# Patient Record
Sex: Female | Born: 1975 | Race: Black or African American | Hispanic: No | Marital: Married | Smoking: Former smoker
Health system: Southern US, Community
[De-identification: ages and names within clinical notes are randomized; demographics above are authoritative.]

## PROBLEM LIST (undated history)

## (undated) ENCOUNTER — Inpatient Hospital Stay (HOSPITAL_COMMUNITY): Payer: Self-pay

## (undated) DIAGNOSIS — J302 Other seasonal allergic rhinitis: Secondary | ICD-10-CM

## (undated) DIAGNOSIS — I10 Essential (primary) hypertension: Secondary | ICD-10-CM

## (undated) DIAGNOSIS — F32A Depression, unspecified: Secondary | ICD-10-CM

## (undated) DIAGNOSIS — K219 Gastro-esophageal reflux disease without esophagitis: Secondary | ICD-10-CM

## (undated) DIAGNOSIS — F419 Anxiety disorder, unspecified: Secondary | ICD-10-CM

## (undated) DIAGNOSIS — A609 Anogenital herpesviral infection, unspecified: Secondary | ICD-10-CM

## (undated) DIAGNOSIS — D219 Benign neoplasm of connective and other soft tissue, unspecified: Secondary | ICD-10-CM

## (undated) DIAGNOSIS — G43909 Migraine, unspecified, not intractable, without status migrainosus: Secondary | ICD-10-CM

## (undated) DIAGNOSIS — O09513 Supervision of elderly primigravida, third trimester: Secondary | ICD-10-CM

## (undated) HISTORY — DX: Essential (primary) hypertension: I10

## (undated) HISTORY — DX: Gastro-esophageal reflux disease without esophagitis: K21.9

## (undated) HISTORY — DX: Supervision of elderly primigravida, third trimester: O09.513

## (undated) HISTORY — DX: Depression, unspecified: F32.A

## (undated) HISTORY — PX: WISDOM TOOTH EXTRACTION: SHX21

## (undated) HISTORY — DX: Anxiety disorder, unspecified: F41.9

## (undated) HISTORY — DX: Other seasonal allergic rhinitis: J30.2

## (undated) HISTORY — DX: Anogenital herpesviral infection, unspecified: A60.9

---

## 2003-04-19 ENCOUNTER — Emergency Department (HOSPITAL_COMMUNITY): Admission: AD | Admit: 2003-04-19 | Discharge: 2003-04-19 | Payer: Self-pay | Admitting: Family Medicine

## 2004-03-19 ENCOUNTER — Emergency Department (HOSPITAL_COMMUNITY): Admission: EM | Admit: 2004-03-19 | Discharge: 2004-03-19 | Payer: Self-pay | Admitting: Family Medicine

## 2004-09-03 ENCOUNTER — Emergency Department (HOSPITAL_COMMUNITY): Admission: EM | Admit: 2004-09-03 | Discharge: 2004-09-03 | Payer: Self-pay | Admitting: Emergency Medicine

## 2007-11-07 ENCOUNTER — Emergency Department (HOSPITAL_COMMUNITY): Admission: EM | Admit: 2007-11-07 | Discharge: 2007-11-07 | Payer: Self-pay | Admitting: Emergency Medicine

## 2007-11-09 ENCOUNTER — Emergency Department (HOSPITAL_COMMUNITY): Admission: EM | Admit: 2007-11-09 | Discharge: 2007-11-09 | Payer: Self-pay | Admitting: Emergency Medicine

## 2008-01-08 ENCOUNTER — Encounter: Admission: RE | Admit: 2008-01-08 | Discharge: 2008-01-08 | Payer: Self-pay | Admitting: Neurological Surgery

## 2008-02-05 ENCOUNTER — Encounter: Admission: RE | Admit: 2008-02-05 | Discharge: 2008-02-05 | Payer: Self-pay | Admitting: Neurological Surgery

## 2008-03-04 ENCOUNTER — Encounter: Admission: RE | Admit: 2008-03-04 | Discharge: 2008-03-04 | Payer: Self-pay | Admitting: Dermatology

## 2009-10-02 ENCOUNTER — Emergency Department (HOSPITAL_COMMUNITY): Admission: EM | Admit: 2009-10-02 | Discharge: 2009-10-02 | Payer: Self-pay | Admitting: Emergency Medicine
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
## 2011-01-01 LAB — CBC
HCT: 43.8
Hemoglobin: 14.6
MCHC: 33.4
WBC: 7

## 2011-01-01 LAB — DIFFERENTIAL
Basophils Absolute: 0
Basophils Relative: 0
Eosinophils Absolute: 0
Eosinophils Relative: 0
Lymphs Abs: 1

## 2011-02-26 ENCOUNTER — Other Ambulatory Visit: Payer: Self-pay

## 2011-02-26 ENCOUNTER — Encounter: Payer: Self-pay | Admitting: *Deleted

## 2011-02-26 ENCOUNTER — Emergency Department (HOSPITAL_COMMUNITY)
Admission: EM | Admit: 2011-02-26 | Discharge: 2011-02-26 | Disposition: A | Discharge status: Home / Self Care | Payer: 59 | Attending: Emergency Medicine | Admitting: Emergency Medicine

## 2011-02-26 DIAGNOSIS — R0789 Other chest pain: Secondary | ICD-10-CM

## 2011-02-26 DIAGNOSIS — R079 Chest pain, unspecified: Secondary | ICD-10-CM | POA: Insufficient documentation

## 2011-02-26 DIAGNOSIS — F17200 Nicotine dependence, unspecified, uncomplicated: Secondary | ICD-10-CM | POA: Insufficient documentation

## 2011-02-26 DIAGNOSIS — Z79899 Other long term (current) drug therapy: Secondary | ICD-10-CM | POA: Insufficient documentation

## 2011-02-26 LAB — URINALYSIS, ROUTINE W REFLEX MICROSCOPIC
Bilirubin Urine: NEGATIVE
Ketones, ur: 15 mg/dL — AB
Nitrite: NEGATIVE
Protein, ur: NEGATIVE mg/dL
Specific Gravity, Urine: 1.019 (ref 1.005–1.030)

## 2011-02-26 LAB — CBC
MCH: 29.2 pg (ref 26.0–34.0)
MCHC: 33 g/dL (ref 30.0–36.0)
RBC: 4.69 MIL/uL (ref 3.87–5.11)
RDW: 13.8 % (ref 11.5–15.5)
WBC: 4.8 10*3/uL (ref 4.0–10.5)

## 2011-02-26 LAB — URINE MICROSCOPIC-ADD ON

## 2011-02-26 LAB — TROPONIN I: Troponin I: <0.3 ng/mL (ref <0.30)

## 2011-02-26 MED ORDER — FAMOTIDINE 20 MG PO TABS: 20.0000 mg | ORAL_TABLET | Freq: Two times a day (BID) | ORAL | Status: DC

## 2011-02-26 NOTE — ED Notes (Signed)
The pt woke up from a nap approx 1330 today with generalized chest pain with sl nausea initially none now. No previous history

## 2011-02-26 NOTE — ED Notes (Signed)
Pt waiting on lab results

## 2011-02-26 NOTE — ED Notes (Signed)
The pt is c/o of the same chest pain no distress.  Some better.  [place]

## 2011-02-26 NOTE — ED Provider Notes (Signed)
History     CSN: [id number] Arrival date & time: 02/26/2011  6:38 PM   First MD Initiated Contact with Patient 02/26/11 2146      Chief Complaint  Patient presents with  . Chest Pain    (Consider location/radiation/quality/duration/timing/severity/associated sxs/prior treatment) Patient is a 35 y.o. female presenting with chest pain. The history is provided by the patient.  Chest Pain The chest pain began 6 - 12 hours ago. Duration of episode(s) is 5 minutes. Chest pain occurs intermittently. The chest pain is improving. The pain is currently at 0/10. The quality of the pain is described as burning. The pain does not radiate. Chest pain is worsened by eating. She tried nothing for the symptoms. Risk factors include no known risk factors.     Past Medical History  Diagnosis Date  . Head ache     History reviewed. No pertinent past surgical history.  History reviewed. No pertinent family history.  History  Substance Use Topics  . Smoking status: Current Everyday Smoker  . Smokeless tobacco: Not on file  . Alcohol Use: Yes    OB History    Grav Para Term Preterm Abortions TAB SAB Ect Mult Living                  Review of Systems  Cardiovascular: Positive for chest pain.  All other systems reviewed and are negative.    Allergies  Review of patient's allergies indicates no known allergies.  Home Medications   Current Outpatient Rx  Name Route Sig Dispense Refill  . CETIRIZINE HCL 10 MG PO TABS Oral Take 10 mg by mouth daily.      . CYCLOBENZAPRINE HCL 10 MG PO TABS Oral Take 10 mg by mouth 3 (three) times daily as needed. For spasms     . LEVONORGESTREL-ETHINYL ESTRAD 90-20 MCG PO TABS Oral Take 1 tablet by mouth daily.      Carma Leaven M PLUS PO TABS Oral Take 1 tablet by mouth daily.      Marland Kitchen PROMETHAZINE HCL 25 MG PO TABS Oral Take 25 mg by mouth every 6 (six) hours as needed. For nausea from migraines     . SERTRALINE HCL 50 MG PO TABS Oral Take 50 mg by mouth  daily.      . SUMATRIPTAN SUCCINATE 100 MG PO TABS Oral Take 100 mg by mouth every 2 (two) hours as needed. For migraines       BP 138/90  Pulse 78  Temp(Src) 98.2 F (36.8 C) (Oral)  Resp 18  SpO2 100%  LMP 01/26/2011  Physical Exam  Constitutional: She is oriented to person, place, and time. She appears well-developed and well-nourished.  HENT:  Head: Normocephalic.  Eyes: Pupils are equal, round, and reactive to light.  Neck: Normal range of motion. Neck supple.  Cardiovascular: Normal rate, regular rhythm and normal heart sounds.   Pulmonary/Chest: Effort normal and breath sounds normal.  Abdominal: Soft. Bowel sounds are normal.  Musculoskeletal: Normal range of motion.  Neurological: She is alert and oriented to person, place, and time.  Skin: Skin is warm and dry.  Psychiatric: She has a normal mood and affect.    ED Course  Procedures (including critical care time)  Date: 02/26/2011  Rate:70  Rhythm: normal sinus rhythm  QRS Axis: normal  Intervals: normal  ST/T Wave abnormalities: normal  Conduction Disutrbances:none  Narrative Interpretation:   Old EKG Reviewed: none available  Labs Reviewed  URINALYSIS, ROUTINE W REFLEX MICROSCOPIC -  Abnormal; Notable for the following:    Appearance CLOUDY (*)    Ketones, ur 15 (*)    Leukocytes, UA SMALL (*)    All other components within normal limits  URINE MICROSCOPIC-ADD ON - Abnormal; Notable for the following:    Squamous Epithelial / LPF MANY (*)    Bacteria, UA MANY (*)    All other components within normal limits  CBC  PREGNANCY, URINE  TROPONIN I   No results found.   No diagnosis found.  Urine will be cultured--patient will be contacted if results indicate treatment needed.  Labs reviewed and discussed with patient.  MDM  Chest pain--suspect GI component        Jimmye Norman, NP 02/26/11 2256  Jimmye Norman, NP 02/26/11 [phone]

## 2011-02-27 NOTE — ED Provider Notes (Signed)
Medical screening examination/treatment/procedure(s) were performed by non-physician practitioner and as supervising physician I was immediately available for consultation/collaboration.  Nicholes Stairs, MD 02/27/11 [phone]

## 2013-11-20 DIAGNOSIS — D259 Leiomyoma of uterus, unspecified: Secondary | ICD-10-CM | POA: Insufficient documentation

## 2014-03-08 DIAGNOSIS — F419 Anxiety disorder, unspecified: Secondary | ICD-10-CM | POA: Insufficient documentation

## 2014-09-23 ENCOUNTER — Encounter (HOSPITAL_COMMUNITY): Payer: Self-pay

## 2014-09-23 ENCOUNTER — Emergency Department (HOSPITAL_COMMUNITY): Payer: Self-pay

## 2014-09-23 ENCOUNTER — Emergency Department (HOSPITAL_COMMUNITY)
Admission: EM | Admit: 2014-09-23 | Discharge: 2014-09-23 | Disposition: A | Discharge status: Home / Self Care | Payer: Self-pay | Attending: Emergency Medicine | Admitting: Emergency Medicine

## 2014-09-23 DIAGNOSIS — R1032 Left lower quadrant pain: Secondary | ICD-10-CM

## 2014-09-23 DIAGNOSIS — R110 Nausea: Secondary | ICD-10-CM

## 2014-09-23 DIAGNOSIS — R42 Dizziness and giddiness: Secondary | ICD-10-CM

## 2014-09-23 DIAGNOSIS — R000 Tachycardia, unspecified: Secondary | ICD-10-CM | POA: Insufficient documentation

## 2014-09-23 DIAGNOSIS — Z8679 Personal history of other diseases of the circulatory system: Secondary | ICD-10-CM | POA: Insufficient documentation

## 2014-09-23 DIAGNOSIS — I951 Orthostatic hypotension: Secondary | ICD-10-CM | POA: Insufficient documentation

## 2014-09-23 DIAGNOSIS — R102 Pelvic and perineal pain unspecified side: Secondary | ICD-10-CM

## 2014-09-23 DIAGNOSIS — D259 Leiomyoma of uterus, unspecified: Secondary | ICD-10-CM

## 2014-09-23 DIAGNOSIS — Z86018 Personal history of other benign neoplasm: Secondary | ICD-10-CM | POA: Insufficient documentation

## 2014-09-23 DIAGNOSIS — N922 Excessive menstruation at puberty: Secondary | ICD-10-CM

## 2014-09-23 DIAGNOSIS — Z79899 Other long term (current) drug therapy: Secondary | ICD-10-CM | POA: Insufficient documentation

## 2014-09-23 DIAGNOSIS — Z793 Long term (current) use of hormonal contraceptives: Secondary | ICD-10-CM | POA: Insufficient documentation

## 2014-09-23 DIAGNOSIS — N939 Abnormal uterine and vaginal bleeding, unspecified: Secondary | ICD-10-CM

## 2014-09-23 DIAGNOSIS — R1031 Right lower quadrant pain: Secondary | ICD-10-CM

## 2014-09-23 DIAGNOSIS — Z87891 Personal history of nicotine dependence: Secondary | ICD-10-CM | POA: Insufficient documentation

## 2014-09-23 DIAGNOSIS — Z3202 Encounter for pregnancy test, result negative: Secondary | ICD-10-CM | POA: Insufficient documentation

## 2014-09-23 HISTORY — DX: Benign neoplasm of connective and other soft tissue, unspecified: D21.9

## 2014-09-23 HISTORY — DX: Migraine, unspecified, not intractable, without status migrainosus: G43.909

## 2014-09-23 LAB — COMPREHENSIVE METABOLIC PANEL
ALBUMIN: 3.6 g/dL (ref 3.5–5.0)
ALT: 18 U/L (ref 14–54)
ANION GAP: 6 (ref 5–15)
AST: 24 U/L (ref 15–41)
Alkaline Phosphatase: 53 U/L (ref 38–126)
BUN: 9 mg/dL (ref 6–20)
CALCIUM: 8.6 mg/dL — AB (ref 8.9–10.3)
CO2: 26 mmol/L (ref 22–32)
CREATININE: 0.96 mg/dL (ref 0.44–1.00)
Chloride: 104 mmol/L (ref 101–111)
GFR calc Af Amer: >60 mL/min (ref >60)
GFR calc non Af Amer: >60 mL/min (ref >60)
GLUCOSE: 97 mg/dL (ref 65–99)
Potassium: 3.8 mmol/L (ref 3.5–5.1)
Sodium: 136 mmol/L (ref 135–145)
TOTAL PROTEIN: 6.9 g/dL (ref 6.5–8.1)
Total Bilirubin: 0.4 mg/dL (ref 0.3–1.2)

## 2014-09-23 LAB — URINE MICROSCOPIC-ADD ON

## 2014-09-23 LAB — URINALYSIS, ROUTINE W REFLEX MICROSCOPIC
Bilirubin Urine: NEGATIVE
Glucose, UA: NEGATIVE mg/dL
KETONES UR: NEGATIVE mg/dL
Leukocytes, UA: NEGATIVE
Nitrite: NEGATIVE
Protein, ur: NEGATIVE mg/dL
Specific Gravity, Urine: 1.018 (ref 1.005–1.030)
UROBILINOGEN UA: 0.2 mg/dL (ref 0.0–1.0)
pH: 6 (ref 5.0–8.0)

## 2014-09-23 LAB — CBC WITH DIFFERENTIAL/PLATELET
BASOS PCT: 0 % (ref 0–1)
Basophils Absolute: 0 10*3/uL (ref 0.0–0.1)
EOS ABS: 0 10*3/uL (ref 0.0–0.7)
EOS PCT: 0 % (ref 0–5)
HEMATOCRIT: 34.5 % — AB (ref 36.0–46.0)
Hemoglobin: 11.1 g/dL — ABNORMAL LOW (ref 12.0–15.0)
Lymphocytes Relative: 21 % (ref 12–46)
Lymphs Abs: 1.4 10*3/uL (ref 0.7–4.0)
MCH: 27.9 pg (ref 26.0–34.0)
MCHC: 32.2 g/dL (ref 30.0–36.0)
MCV: 86.7 fL (ref 78.0–100.0)
MONOS PCT: 6 % (ref 3–12)
Monocytes Absolute: 0.4 10*3/uL (ref 0.1–1.0)
NEUTROS ABS: 5.1 10*3/uL (ref 1.7–7.7)
Neutrophils Relative %: 73 % (ref 43–77)
PLATELETS: 262 10*3/uL (ref 150–400)
RBC: 3.98 MIL/uL (ref 3.87–5.11)
RDW: 14.5 % (ref 11.5–15.5)
WBC: 6.9 10*3/uL (ref 4.0–10.5)

## 2014-09-23 LAB — WET PREP, GENITAL
CLUE CELLS WET PREP: NONE SEEN
TRICH WET PREP: NONE SEEN
Yeast Wet Prep HPF POC: NONE SEEN

## 2014-09-23 LAB — LIPASE, BLOOD: LIPASE: 11 U/L — AB (ref 22–51)

## 2014-09-23 LAB — POC URINE PREG, ED: Preg Test, Ur: NEGATIVE

## 2014-09-23 MED ORDER — SODIUM CHLORIDE 0.9 % IV BOLUS (SEPSIS)
1000.0000 mL | Freq: Once | INTRAVENOUS | Status: AC
  Administered 2014-09-23: 1000 mL via INTRAVENOUS

## 2014-09-23 MED ORDER — MORPHINE SULFATE 4 MG/ML IJ SOLN
4.0000 mg | Freq: Once | INTRAMUSCULAR | Status: AC
  Administered 2014-09-23: 4 mg via INTRAVENOUS
  Filled 2014-09-23: qty 1

## 2014-09-23 MED ORDER — ONDANSETRON HCL 8 MG PO TABS: 8.0000 mg | ORAL_TABLET | Freq: Three times a day (TID) | ORAL | Status: DC | PRN

## 2014-09-23 MED ORDER — ONDANSETRON HCL 8 MG PO TABS
8.0000 mg | ORAL_TABLET | Freq: Three times a day (TID) | ORAL | Status: DC | PRN
Start: 2014-09-23 — End: 2014-09-23

## 2014-09-23 MED ORDER — ONDANSETRON HCL 4 MG/2ML IJ SOLN
4.0000 mg | Freq: Once | INTRAMUSCULAR | Status: AC
  Administered 2014-09-23: 4 mg via INTRAVENOUS
  Filled 2014-09-23: qty 2

## 2014-09-23 MED ORDER — MEFENAMIC ACID 250 MG PO CAPS: 500.0000 mg | ORAL_CAPSULE | Freq: Three times a day (TID) | ORAL | Status: DC

## 2014-09-23 MED ORDER — HYDROCODONE-ACETAMINOPHEN 5-325 MG PO TABS: 1.0000 | ORAL_TABLET | Freq: Four times a day (QID) | ORAL | Status: DC | PRN

## 2014-09-23 NOTE — ED Notes (Signed)
Pt c/o lower abdominal pain and heavy vaginal bleeding x 3 days.  Pain score 7/10.  Pt reports that she is on her menstrual period, but it has never been this heavy.  Pt reports "gushes of blood that saturate extra large pads" x 3 times per day.  Hx of fibroids.

## 2014-09-23 NOTE — Discharge Instructions (Signed)
Your vaginal bleeding and lower abdomen pain is likely from uterine fibroids. Your lightheadedness was from mild dehydration and from the heavy menstrual cycle you're experiencing, but your blood counts are not dangerously low. You will need to use mefenamic acid as directed to help with your pain and bleeding, and follow up with your OBGYN in 3-5 days for recheck. Use tylenol or ibuprofen as needed for pain, or use norco as directed as needed for severe pain but don't drive while taking norco. Stay very well hydrated. Use zofran as directed as needed for nausea. Return to the ER for changes or worsening symptoms.  Abdominal (belly) pain can be caused by many things. Your caregiver performed an examination and possibly ordered blood/urine tests and imaging (CT scan, x-rays, ultrasound). Many cases can be observed and treated at home after initial evaluation in the emergency department. Even though you are being discharged home, abdominal pain can be unpredictable. Therefore, you need a repeated exam if your pain does not resolve, returns, or worsens. Most patients with abdominal pain don't have to be admitted to the hospital or have surgery, but serious problems like appendicitis and gallbladder attacks can start out as nonspecific pain. Many abdominal conditions cannot be diagnosed in one visit, so follow-up evaluations are very important. SEEK IMMEDIATE MEDICAL ATTENTION IF YOU DEVELOP ANY OF THE FOLLOWING SYMPTOMS:  The pain does not go away or becomes severe.   A temperature above 101 develops.   Repeated vomiting occurs (multiple episodes).   The pain becomes localized to portions of the abdomen. The right side could possibly be appendicitis. In an adult, the left lower portion of the abdomen could be colitis or diverticulitis.   Blood is being passed in stools or vomit (bright red or black tarry stools).   Return also if you develop chest pain, difficulty breathing, dizziness or fainting, or  become confused, poorly responsive, or inconsolable (young children).  The constipation stays for more than 4 days.   There is belly (abdominal) or rectal pain.   You do not seem to be getting better.     Abnormal Uterine Bleeding Abnormal uterine bleeding means bleeding from the vagina that is not your normal menstrual period. This can be:  Bleeding or spotting between periods.  Bleeding after sex (sexual intercourse).  Bleeding that is heavier or more than normal.  Periods that last longer than usual.  Bleeding after menopause. There are many problems that may cause this. Treatment will depend on the cause of the bleeding. Any kind of bleeding that is not normal should be reviewed by your doctor.  HOME CARE Watch your condition for any changes. These actions may lessen any discomfort you are having:  Do not use tampons or douches as told by your doctor.  Change your pads often. You should get regular pelvic exams and Pap tests. Keep all appointments for tests as told by your doctor. GET HELP IF:  You are bleeding for more than 1 week.  You feel dizzy at times. GET HELP RIGHT AWAY IF:   You pass out.  You have to change pads every 15 to 30 minutes.  You have belly pain.  You have a fever.  You become sweaty or weak.  You are passing large blood clots from the vagina.  You feel sick to your stomach (nauseous) and throw up (vomit). MAKE SURE YOU:  Understand these instructions.  Will watch your condition.  Will get help right away if you are not doing well  or get worse. Document Released: 01/17/2009 Document Revised: 03/27/2013 Document Reviewed: 10/19/2012 [facility] Patient Information 2015 [place], [place]. This information is not intended to replace advice given to you by your health care provider. Make sure you discuss any questions you have with your health care provider.  Uterine Fibroid A uterine fibroid is a growth (tumor) in your uterus. It is not  cancerous. Some fibroids cause pain or heavy bleeding during and in between periods. [place] care depends on how you were treated. In general:  Keep all doctor visits.  Only take medicine as told by your doctor. Do not take aspirin.  Talk to your doctor about taking iron pills.  If you have painful periods that are not heavy, lie down with your feet up. Put cold packs on your belly (abdomen).  If you have heavy periods, tell your doctor how many pads or tampons you use in a month.  Eat green vegetables. GET HELP RIGHT AWAY IF:   You have lower belly (pelvic) pain or cramps not helped by medicine.  You have sudden lower belly pain that gets worse.  You have more bleeding between and during your periods.  If you soak your tampon or pad in 30 minutes or less.  You feel lightheaded or faint. MAKE SURE YOU:   Understand these instructions.  Will watch your condition.  Will get help right away if you are not doing well or get worse. Document Released: 06/18/2008 Document Revised: 01/10/2013 Document Reviewed: 10/19/2012 [facility] Patient Information 2015 [place], [place]. This information is not intended to replace advice given to you by your health care provider. Make sure you discuss any questions you have with your health care provider.  Menorrhagia Menorrhagia is the medical term for when your menstrual periods are heavy or last longer than usual. With menorrhagia, every period you have may cause enough blood loss and cramping that you are unable to maintain your usual activities. CAUSES  In some cases, the cause of heavy periods is unknown, but a number of conditions may cause menorrhagia. Common causes include:  A problem with the hormone-producing thyroid gland (hypothyroid).  Noncancerous growths in the uterus (polyps or fibroids).  An imbalance of the estrogen and progesterone hormones.  One of your ovaries not releasing an egg during one or more months.  Side  effects of having an intrauterine device (IUD).  Side effects of some medicines, such as anti-inflammatory medicines or blood thinners.  A bleeding disorder that stops your blood from clotting normally. SIGNS AND SYMPTOMS  During a normal period, bleeding lasts between 4 and 8 days. Signs that your periods are too heavy include:  You routinely have to change your pad or tampon every 1 or 2 hours because it is completely soaked.  You pass blood clots larger than 1 inch (2.5 cm) in size.  You have bleeding for more than 7 days.  You need to use pads and tampons at the same time because of heavy bleeding.  You need to wake up to change your pads or tampons during the night.  You have symptoms of anemia, such as tiredness, fatigue, or shortness of breath. DIAGNOSIS  Your health care provider will perform a physical exam and ask you questions about your symptoms and menstrual history. Other tests may be ordered based on what the health care provider finds during the exam. These tests can include:  Blood tests. Blood tests are used to check if you are pregnant or have hormonal changes, a  bleeding or thyroid disorder, low iron levels (anemia), or other problems.  Endometrial biopsy. Your health care provider takes a sample of tissue from the inside of your uterus to be examined under a microscope.  Pelvic ultrasound. This test uses sound waves to make a picture of your uterus, ovaries, and vagina. The pictures can show if you have fibroids or other growths.  Hysteroscopy. For this test, your health care provider will use a small telescope to look inside your uterus. Based on the results of your initial tests, your health care provider may recommend further testing. TREATMENT  Treatment may not be needed. If it is needed, your health care provider may recommend treatment with one or more medicines first. If these do not reduce bleeding enough, a surgical treatment might be an option. The best  treatment for you will depend on:   Whether you need to prevent pregnancy.  Your desire to have children in the future.  The cause and severity of your bleeding.  Your opinion and personal preference.  Medicines for menorrhagia may include:  Birth control methods that use hormones. These include the pill, skin patch, vaginal ring, shots that you get every 3 months, hormonal IUD, and implant. These treatments reduce bleeding during your menstrual period.  Medicines that thicken blood and slow bleeding.  Medicines that reduce swelling, such as ibuprofen.  Medicines that contain a synthetic hormone called progestin.   Medicines that make the ovaries stop working for a short time.  You may need surgical treatment for menorrhagia if the medicines are unsuccessful. Treatment options include:  Dilation and curettage (D&C). In this procedure, your health care provider opens (dilates) your cervix and then scrapes or suctions tissue from the lining of your uterus to reduce menstrual bleeding.  Operative hysteroscopy. This procedure uses a tiny tube with a light (hysteroscope) to view your uterine cavity and can help in the surgical removal of a polyp that may be causing heavy periods.  Endometrial ablation. Through various techniques, your health care provider permanently destroys the entire lining of your uterus (endometrium). After endometrial ablation, most women have little or no menstrual flow. Endometrial ablation reduces your ability to become pregnant.  Endometrial resection. This surgical procedure uses an electrosurgical wire loop to remove the lining of the uterus. This procedure also reduces your ability to become pregnant.  Hysterectomy. Surgical removal of the uterus and cervix is a permanent procedure that stops menstrual periods. Pregnancy is not possible after a hysterectomy. This procedure requires anesthesia and hospitalization. HOME CARE INSTRUCTIONS   Only take  over-the-counter or prescription medicines as directed by your health care provider. Take prescribed medicines exactly as directed. Do not change or switch medicines without consulting your health care provider.  Take any prescribed iron pills exactly as directed by your health care provider. Long-term heavy bleeding may result in low iron levels. Iron pills help replace the iron your body lost from heavy bleeding. Iron may cause constipation. If this becomes a problem, increase the bran, fruits, and roughage in your diet.  Do not take aspirin or medicines that contain aspirin 1 week before or during your menstrual period. Aspirin may make the bleeding worse.  If you need to change your sanitary pad or tampon more than once every 2 hours, stay in bed and rest as much as possible until the bleeding stops.  Eat well-balanced meals. Eat foods high in iron. Examples are leafy green vegetables, meat, liver, eggs, and whole grain breads and cereals. Do  not try to lose weight until the abnormal bleeding has stopped and your blood iron level is back to normal. SEEK MEDICAL CARE IF:   You soak through a pad or tampon every 1 or 2 hours, and this happens every time you have a period.  You need to use pads and tampons at the same time because you are bleeding so much.  You need to change your pad or tampon during the night.  You have a period that lasts for more than 8 days.  You pass clots bigger than 1 inch wide.  You have irregular periods that happen more or less often than once a month.  You feel dizzy or faint.  You feel very weak or tired.  You feel short of breath or feel your heart is beating too fast when you exercise.  You have nausea and vomiting or diarrhea while you are taking your medicine.  You have any problems that may be related to the medicine you are taking. SEEK IMMEDIATE MEDICAL CARE IF:   You soak through 4 or more pads or tampons in 2 hours.  You have any bleeding  while you are pregnant. MAKE SURE YOU:   Understand these instructions.  Will watch your condition.  Will get help right away if you are not doing well or get worse. Document Released: 03/22/2005 Document Revised: 03/27/2013 Document Reviewed: 09/10/2012 [facility] Patient Information 2015 [place], [place]. This information is not intended to replace advice given to you by your health care provider. Make sure you discuss any questions you have with your health care provider.  Nausea, Adult Nausea is the feeling that you have an upset stomach or have to vomit. Nausea by itself is not likely a serious concern, but it may be an early sign of more serious medical problems. As nausea gets worse, it can lead to vomiting. If vomiting develops, there is the risk of dehydration.  CAUSES   Viral infections.  Food poisoning.  Medicines.  Pregnancy.  Motion sickness.  Migraine headaches.  Emotional distress.  Severe pain from any source.  Alcohol intoxication. HOME CARE INSTRUCTIONS  Get plenty of rest.  Ask your caregiver about specific rehydration instructions.  Eat small amounts of food and sip liquids more often.  Take all medicines as told by your caregiver. SEEK MEDICAL CARE IF:  You have not improved after 2 days, or you get worse.  You have a headache. SEEK IMMEDIATE MEDICAL CARE IF:   You have a fever.  You faint.  You keep vomiting or have blood in your vomit.  You are extremely weak or dehydrated.  You have dark or bloody stools.  You have severe chest or abdominal pain. MAKE SURE YOU:  Understand these instructions.  Will watch your condition.  Will get help right away if you are not doing well or get worse. Document Released: 04/29/2004 Document Revised: 12/15/2011 Document Reviewed: 12/02/2010 [facility] Patient Information 2015 [place], [place]. This information is not intended to replace advice given to you by your health care provider. Make sure you  discuss any questions you have with your health care provider.

## 2014-09-23 NOTE — Progress Notes (Signed)
Pt confirms Aimee Kerr is  pcp EPIC updated

## 2014-09-23 NOTE — ED Notes (Signed)
PA in room

## 2014-09-23 NOTE — ED Provider Notes (Signed)
CSN: [id number]     Arrival date & time 09/23/14  1030 History   First MD Initiated Contact with Patient 09/23/14 1209     Chief Complaint  Patient presents with  . Abdominal Pain  . Vaginal Bleeding     (Consider location/radiation/quality/duration/timing/severity/associated sxs/prior Treatment) HPI Comments: Aimee Kerr is a 39 y.o. female with a PMHx of uterine fibroids and chronic headaches/migraines, who presents to the ED with complaints of heavy vaginal bleeding and lower abdominal pain 3 days. She reports her vaginal bleeding is bright red with passage of clots, consuming 34 extra-large pads per day, heavier than her normal menses. She is currently on her menstrual cycle, stating that she takes [facility] and therefore she only has 4 menses yearly, starting this menses on Friday. She endorses associated 8/10 lower abdominal cramping which is intermittent, nonradiating, worse with episodes of vaginal bleeding, and improved with tramadol. Additional associated symptoms include nausea and lightheadedness. Her OB/GYN is Dr. Benjaman Kindler. She denies any fevers, chills, chest pain, shortness of breath, dizziness, syncope, headache, vision changes, vomiting, diarrhea, constipation, dysuria, hematuria, vaginal discharge, vaginal itching or lesions, numbness, tingling, or focal weakness. She is sexually active with 1 partner, unprotected.  Patient is a 39 y.o. female presenting with vaginal bleeding. The history is provided by the patient. No language interpreter was used.  Vaginal Bleeding Quality:  Bright red, clots and heavier than menses Severity:  Moderate Onset quality:  Gradual Duration:  3 days Timing:  Constant Progression:  Worsening Chronicity:  New Menstrual history:  Regular (on seasonique, only gets 4 menses yearly) Number of pads used:  3-4 extra large pads/day Possible pregnancy: no   Context: spontaneously   Relieved by: tramadol. Worsened by:  Nothing  tried Ineffective treatments:  None tried Associated symptoms: abdominal pain and nausea   Associated symptoms: no dizziness, no dysuria, no fever and no vaginal discharge   Risk factors: unprotected sex   Risk factors comment:  Hx of fibroids   Past Medical History  Diagnosis Date  . Head ache   . Fibroids   . Migraines    Past Surgical History  Procedure Laterality Date  . Wisdom tooth extraction     History reviewed. No pertinent family history. History  Substance Use Topics  . Smoking status: Former Research scientist (life sciences)  . Smokeless tobacco: Not on file  . Alcohol Use: Yes     Comment: occ   OB History    No data available     Review of Systems  Constitutional: Negative for fever and chills.  Eyes: Negative for visual disturbance.  Respiratory: Negative for shortness of breath.   Cardiovascular: Negative for chest pain.  Gastrointestinal: Positive for nausea and abdominal pain. Negative for vomiting, diarrhea, constipation and blood in stool.  Genitourinary: Positive for vaginal bleeding. Negative for dysuria, hematuria, flank pain, vaginal discharge, genital sores, vaginal pain and menstrual problem.  Musculoskeletal: Negative for myalgias and arthralgias.  Skin: Negative for color change.  Allergic/Immunologic: Negative for immunocompromised state.  Neurological: Positive for light-headedness. Negative for dizziness, syncope, weakness, numbness and headaches.  Hematological: Does not bruise/bleed easily.  Psychiatric/Behavioral: Negative for confusion.   10 Systems reviewed and are negative for acute change except as noted in the HPI.    Allergies  Other; Formaldehyde; and Sulfa antibiotics  Home Medications   Prior to Admission medications   Medication Sig Start Date End Date Taking? Authorizing Provider  levonorgestrel-ethinyl estradiol (SEASONALE,INTROVALE,JOLESSA) 0.15-0.03 MG tablet Take 1 tablet by mouth daily.  Yes Historical Provider, MD  loratadine (CLARITIN)  10 MG tablet Take 10 mg by mouth daily.   Yes Historical Provider, MD  Multiple Vitamins-Minerals (MULTIVITAMINS THER. W/MINERALS) TABS Take 1 tablet by mouth daily.     Yes Historical Provider, MD   BP 140/86 mmHg  Pulse 117  Temp(Src) 98.3 F (36.8 C) (Oral)  Resp 18  SpO2 100%  LMP 09/19/2014 Physical Exam  Constitutional: She is oriented to person, place, and time. Vital signs are normal. She appears well-developed and well-nourished.  Non-toxic appearance. No distress.  Afebrile, nontoxic, NAD  HENT:  Head: Normocephalic and atraumatic.  Mouth/Throat: Oropharynx is clear and moist and mucous membranes are normal.  Eyes: Conjunctivae and EOM are normal. Right eye exhibits no discharge. Left eye exhibits no discharge.  Neck: Normal range of motion. Neck supple.  Cardiovascular: Regular rhythm, normal heart sounds and intact distal pulses.  Tachycardia present.  Exam reveals no gallop and no friction rub.   No murmur heard. Mildly tachycardic, reg rhythm, nl s1/s2, no m/r/g, distal pulses intact, no pedal edema   Pulmonary/Chest: Effort normal and breath sounds normal. No respiratory distress. She has no decreased breath sounds. She has no wheezes. She has no rhonchi. She has no rales.  Abdominal: Soft. Normal appearance and bowel sounds are normal. She exhibits no distension. There is tenderness in the right lower quadrant, suprapubic area and left lower quadrant. There is no rigidity, no rebound, no guarding, no CVA tenderness, no tenderness at McBurney's point and negative Murphy's sign.    Soft, nondistended, +BS throughout, with lower abd tenderness diffusely along pelvic brim, no r/g/r, neg murphy's, neg mcburney's, no CVA TTP   Genitourinary: Pelvic exam was performed with patient supine. There is no rash, tenderness or lesion on the right labia. There is no rash, tenderness or lesion on the left labia. Uterus is enlarged and tender. Uterus is not deviated and not fixed. Cervix  exhibits no motion tenderness, no discharge and no friability. Right adnexum displays tenderness. Right adnexum displays no mass and no fullness. Left adnexum displays tenderness. Left adnexum displays no mass and no fullness. There is bleeding in the vagina. No erythema in the vagina. No foreign body around the vagina. No vaginal discharge found.  Chaperone present for exam. No rashes, lesions, or tenderness to external genitalia. No erythema, injury, or tenderness to vaginal mucosa. No vaginal discharge but with bright red bleeding from cervical os which is closed, with clots within vaginal vault. No adnexal masses or fullness but mild TTP bilaterally. No CMT, cervical friability, or discharge from cervical os aside from bleeding. Uterus non-deviated, mobile, mildly diffusely TTP, and mildly uniformly enlarged palpable above pubic symphysis.    Musculoskeletal: Normal range of motion.  MAE x4 Strength and sensation grossly intact Distal pulses intact Gait steady  Neurological: She is alert and oriented to person, place, and time. She has normal strength. No sensory deficit.  No focal neuro deficits  Skin: Skin is warm, dry and intact. No rash noted.  Psychiatric: She has a normal mood and affect.  Nursing note and vitals reviewed.   ED Course  Procedures (including critical care time) 13:13 Orthostatic Vital Signs WC  Orthostatic Lying  - BP- Lying: 133/91 mmHg ; Pulse- Lying: 103  Orthostatic Sitting - BP- Sitting: 133/87 mmHg ; Pulse- Sitting: 113  Orthostatic Standing at 0 minutes - BP- Standing at 0 minutes: 132/92 mmHg ; Pulse- Standing at 0 minutes: 113       Labs  Review Labs Reviewed  WET PREP, GENITAL - Abnormal; Notable for the following:    WBC, Wet Prep HPF POC FEW (*)    All other components within normal limits  CBC WITH DIFFERENTIAL/PLATELET - Abnormal; Notable for the following:    Hemoglobin 11.1 (*)    HCT 34.5 (*)    All other components within normal limits   COMPREHENSIVE METABOLIC PANEL - Abnormal; Notable for the following:    Calcium 8.6 (*)    All other components within normal limits  LIPASE, BLOOD - Abnormal; Notable for the following:    Lipase 11 (*)    All other components within normal limits  URINALYSIS, ROUTINE W REFLEX MICROSCOPIC (NOT AT [facility]) - Abnormal; Notable for the following:    Hgb urine dipstick LARGE (*)    All other components within normal limits  URINE MICROSCOPIC-ADD ON  RPR  HIV ANTIBODY (ROUTINE TESTING)  POC URINE PREG, ED  GC/CHLAMYDIA PROBE AMP ([place]) NOT AT [facility]    Imaging Review US Transvaginal Non-ob  09/23/2014   CLINICAL DATA:  Vaginal bleeding and pelvic pain for 4 days.  EXAM: TRANSABDOMINAL AND TRANSVAGINAL ULTRASOUND OF PELVIS  DOPPLER ULTRASOUND OF OVARIES  TECHNIQUE: Both transabdominal and transvaginal ultrasound examinations of the pelvis were performed. Transabdominal technique was performed for global imaging of the pelvis including uterus, ovaries, adnexal regions, and pelvic cul-de-sac.  It was necessary to proceed with endovaginal exam following the transabdominal exam to visualize the ovaries. Color and duplex Doppler ultrasound was utilized to evaluate blood flow to the ovaries.  COMPARISON:  None.  FINDINGS: Uterus  Measurements: 16.8 x 8.7 x 12.9 cm. Uterus is overall enlarged and heterogeneous. Intramural mass in the uterine fundus measures 9.6 x 8.9 x 11.1 cm, compatible with a fibroid. Smaller exophytic lesion extending off the uterine fundus may represent a subserosal fibroid and measures 5.8 x 4.7 x 5.5 cm.  Endometrium  Not visualized.  Right ovary  Measurements: 2.0 x 1.6 x 2.0 cm. Not well visualized. No adnexal mass identified.  Left ovary  Not visualized.  Pulsed Doppler evaluation of the right ovary demonstrates grossly normal low-resistance arterial and venous waveforms.  Other findings  Trace free fluid noted in the pelvis.  IMPRESSION: 1. Uterine fibroids. 2. Grossly  unremarkable appearance of the right ovary. Left ovary not visualized.   Electronically Signed   By: Logan Bores   On: 09/23/2014 15:08   US Pelvis Complete  09/23/2014   CLINICAL DATA:  Vaginal bleeding and pelvic pain for 4 days.  EXAM: TRANSABDOMINAL AND TRANSVAGINAL ULTRASOUND OF PELVIS  DOPPLER ULTRASOUND OF OVARIES  TECHNIQUE: Both transabdominal and transvaginal ultrasound examinations of the pelvis were performed. Transabdominal technique was performed for global imaging of the pelvis including uterus, ovaries, adnexal regions, and pelvic cul-de-sac.  It was necessary to proceed with endovaginal exam following the transabdominal exam to visualize the ovaries. Color and duplex Doppler ultrasound was utilized to evaluate blood flow to the ovaries.  COMPARISON:  None.  FINDINGS: Uterus  Measurements: 16.8 x 8.7 x 12.9 cm. Uterus is overall enlarged and heterogeneous. Intramural mass in the uterine fundus measures 9.6 x 8.9 x 11.1 cm, compatible with a fibroid. Smaller exophytic lesion extending off the uterine fundus may represent a subserosal fibroid and measures 5.8 x 4.7 x 5.5 cm.  Endometrium  Not visualized.  Right ovary  Measurements: 2.0 x 1.6 x 2.0 cm. Not well visualized. No adnexal mass identified.  Left ovary  Not visualized.  Pulsed Doppler evaluation of the right ovary demonstrates grossly normal low-resistance arterial and venous waveforms.  Other findings  Trace free fluid noted in the pelvis.  IMPRESSION: 1. Uterine fibroids. 2. Grossly unremarkable appearance of the right ovary. Left ovary not visualized.   Electronically Signed   By: Logan Bores   On: 09/23/2014 15:08   [place] Art/ven Flow Abd Pelv Doppler  09/23/2014   CLINICAL DATA:  Vaginal bleeding and pelvic pain for 4 days.  EXAM: TRANSABDOMINAL AND TRANSVAGINAL ULTRASOUND OF PELVIS  DOPPLER ULTRASOUND OF OVARIES  TECHNIQUE: Both transabdominal and transvaginal ultrasound examinations of the pelvis were performed. Transabdominal  technique was performed for global imaging of the pelvis including uterus, ovaries, adnexal regions, and pelvic cul-de-sac.  It was necessary to proceed with endovaginal exam following the transabdominal exam to visualize the ovaries. Color and duplex Doppler ultrasound was utilized to evaluate blood flow to the ovaries.  COMPARISON:  None.  FINDINGS: Uterus  Measurements: 16.8 x 8.7 x 12.9 cm. Uterus is overall enlarged and heterogeneous. Intramural mass in the uterine fundus measures 9.6 x 8.9 x 11.1 cm, compatible with a fibroid. Smaller exophytic lesion extending off the uterine fundus may represent a subserosal fibroid and measures 5.8 x 4.7 x 5.5 cm.  Endometrium  Not visualized.  Right ovary  Measurements: 2.0 x 1.6 x 2.0 cm. Not well visualized. No adnexal mass identified.  Left ovary  Not visualized.  Pulsed Doppler evaluation of the right ovary demonstrates grossly normal low-resistance arterial and venous waveforms.  Other findings  Trace free fluid noted in the pelvis.  IMPRESSION: 1. Uterine fibroids. 2. Grossly unremarkable appearance of the right ovary. Left ovary not visualized.   Electronically Signed   By: Logan Bores   On: 09/23/2014 15:08     EKG Interpretation None      MDM   Final diagnoses:  Pelvic pain in female  Uterine leiomyoma, unspecified location  Episode of heavy vaginal bleeding  Excessive menstruation at puberty  Orthostatic lightheadedness  Abdominal cramping, bilateral lower quadrant  Nausea    39 y.o. female here with lower abd pain and vaginal bleeding x3 days, heavier than normal menses, +passage of clots. C/o lightheadedness and nausea. Has a hx of fibroids. On exam, lower abd tenderness diffusely along pelvic brim, nonperitoneal. Mildly tachycardic concerning for possible acute anemia related to vaginal bleeding. Will proceed with labs and pelvic exam, and likely pelvic ultrasound but will hold on ordering imaging until pelvic has been performed. Will get  orthostatics, give fluids, and give nausea/pain meds. Will reassess shortly.   1:01 PM Pelvic exam showing enlarged uterus, mobile, mildly tender diffusely in both adnexa, no discharge, bright red bleeding from os with clots. Will proceed with pelvic u/s.   2:58 PM Orthostatic VS showing tachycardia upon standing, no BP changes, likely related to bleeding and possibly slight dehydration. CBC w/diff showing mild anemia at hgb 11.1. CMP unremarkable. Lipase WNL. Wet prep with few WBC but otherwise unremarkable. Doubt GC/CT, will not empirically treat. Awaiting u/s results as well as upreg and U/A. Will reassess shortly.   3:39 PM Pain improved, nausea improved, tolerating PO well, tachycardia improving after fluids. Upreg neg. U/A with hgb likely from vaginal source, no other abnormalities, doubt UTI. U/S showing fibroids. Will start on mefenamic acid x5 days or until bleeding stops. Will send home with pain meds and nausea meds. Will have her f/up with OBGYN in 3-5 days for recheck. Discussed strict return precautions. I explained the  diagnosis and have given explicit precautions to return to the ER including for any other new or worsening symptoms. The patient understands and accepts the medical plan as it's been dictated and I have answered their questions. Discharge instructions concerning home care and prescriptions have been given. The patient is STABLE and is discharged to home in good condition.  BP 137/87 mmHg  Pulse 96  Temp(Src) 98.3 F (36.8 C) (Oral)  Resp 16  SpO2 100%  LMP 09/19/2014  Meds ordered this encounter  Medications  . ondansetron (ZOFRAN) injection 4 mg    Sig:   . morphine 4 MG/ML injection 4 mg    Sig:   . sodium chloride 0.9 % bolus 1,000 mL    Sig:   . Mefenamic Acid 250 MG CAPS    Sig: Take 2 capsules (500 mg total) by mouth 3 (three) times daily. X 5 days or until bleeding stops    Dispense:  30 each    Refill:  0    Order Specific Question:  Supervising  Provider    Answer:  Sabra Heck, BRIAN [[id number]]  . HYDROcodone-acetaminophen (NORCO) 5-325 MG per tablet    Sig: Take 1 tablet by mouth every 6 (six) hours as needed for severe pain.    Dispense:  6 tablet    Refill:  0    Order Specific Question:  Supervising Provider    Answer:  MILLER, BRIAN [[id number]]  . ondansetron (ZOFRAN) 8 MG tablet    Sig: Take 1 tablet (8 mg total) by mouth every 8 (eight) hours as needed for nausea or vomiting.    Dispense:  10 tablet    Refill:  0    Order Specific Question:  Supervising Provider    Answer:  Noemi Chapel [[id number]]     Myrella Fahs Camprubi-Soms, PA-C 09/23/14 [place], MD 09/24/14 2251

## 2014-09-23 NOTE — ED Notes (Signed)
Pt reports "fist sized" clots along w/ the "gushes."

## 2014-09-24 LAB — GC/CHLAMYDIA PROBE AMP (~~LOC~~) NOT AT ARMC
Chlamydia: NEGATIVE
NEISSERIA GONORRHEA: NEGATIVE

## 2014-09-24 LAB — RPR: RPR Ser Ql: NONREACTIVE

## 2014-09-24 LAB — HIV ANTIBODY (ROUTINE TESTING W REFLEX): HIV Screen 4th Generation wRfx: NONREACTIVE

## 2015-03-17 DIAGNOSIS — Z8669 Personal history of other diseases of the nervous system and sense organs: Secondary | ICD-10-CM | POA: Insufficient documentation

## 2015-05-12 ENCOUNTER — Encounter (HOSPITAL_COMMUNITY): Payer: Self-pay | Admitting: Emergency Medicine

## 2015-05-12 ENCOUNTER — Emergency Department (HOSPITAL_COMMUNITY): Payer: Self-pay

## 2015-05-12 ENCOUNTER — Emergency Department (HOSPITAL_COMMUNITY)
Admission: EM | Admit: 2015-05-12 | Discharge: 2015-05-12 | Disposition: A | Discharge status: Home / Self Care | Payer: Self-pay | Attending: Emergency Medicine | Admitting: Emergency Medicine

## 2015-05-12 DIAGNOSIS — G4489 Other headache syndrome: Secondary | ICD-10-CM | POA: Insufficient documentation

## 2015-05-12 DIAGNOSIS — Z79818 Long term (current) use of other agents affecting estrogen receptors and estrogen levels: Secondary | ICD-10-CM | POA: Insufficient documentation

## 2015-05-12 DIAGNOSIS — Z79899 Other long term (current) drug therapy: Secondary | ICD-10-CM | POA: Insufficient documentation

## 2015-05-12 DIAGNOSIS — Z87891 Personal history of nicotine dependence: Secondary | ICD-10-CM | POA: Insufficient documentation

## 2015-05-12 DIAGNOSIS — I158 Other secondary hypertension: Secondary | ICD-10-CM | POA: Insufficient documentation

## 2015-05-12 DIAGNOSIS — Z86018 Personal history of other benign neoplasm: Secondary | ICD-10-CM | POA: Insufficient documentation

## 2015-05-12 LAB — BASIC METABOLIC PANEL
Anion gap: 8 (ref 5–15)
BUN: 14 mg/dL (ref 6–20)
CALCIUM: 8.8 mg/dL — AB (ref 8.9–10.3)
CO2: 25 mmol/L (ref 22–32)
Chloride: 110 mmol/L (ref 101–111)
Creatinine, Ser: 0.95 mg/dL (ref 0.44–1.00)
GFR calc non Af Amer: >60 mL/min (ref >60)
Glucose, Bld: 95 mg/dL (ref 65–99)
POTASSIUM: 4.1 mmol/L (ref 3.5–5.1)
SODIUM: 143 mmol/L (ref 135–145)

## 2015-05-12 LAB — CBC WITH DIFFERENTIAL/PLATELET
BASOS PCT: 1 %
Basophils Absolute: 0 10*3/uL (ref 0.0–0.1)
EOS PCT: 1 %
Eosinophils Absolute: 0 10*3/uL (ref 0.0–0.7)
HCT: 35.4 % — ABNORMAL LOW (ref 36.0–46.0)
Hemoglobin: 11.4 g/dL — ABNORMAL LOW (ref 12.0–15.0)
Lymphocytes Relative: 36 %
Lymphs Abs: 1.5 10*3/uL (ref 0.7–4.0)
MCH: 27.5 pg (ref 26.0–34.0)
MCHC: 32.2 g/dL (ref 30.0–36.0)
MCV: 85.3 fL (ref 78.0–100.0)
MONO ABS: 0.2 10*3/uL (ref 0.1–1.0)
Monocytes Relative: 6 %
Neutro Abs: 2.4 10*3/uL (ref 1.7–7.7)
Neutrophils Relative %: 56 %
PLATELETS: 285 10*3/uL (ref 150–400)
RBC: 4.15 MIL/uL (ref 3.87–5.11)
RDW: 14 % (ref 11.5–15.5)
WBC: 4.1 10*3/uL (ref 4.0–10.5)

## 2015-05-12 LAB — TSH: TSH: 5.265 u[IU]/mL — AB (ref 0.350–4.500)

## 2015-05-12 MED ORDER — HYDROCHLOROTHIAZIDE 25 MG PO TABS: 25.0000 mg | ORAL_TABLET | Freq: Every day | ORAL | Status: DC

## 2015-05-12 MED ORDER — DIPHENHYDRAMINE HCL 50 MG/ML IJ SOLN
25.0000 mg | Freq: Once | INTRAMUSCULAR | Status: AC
  Administered 2015-05-12: 25 mg via INTRAVENOUS
  Filled 2015-05-12: qty 1

## 2015-05-12 MED ORDER — METOCLOPRAMIDE HCL 5 MG/ML IJ SOLN
10.0000 mg | Freq: Once | INTRAMUSCULAR | Status: AC
  Administered 2015-05-12: 10 mg via INTRAVENOUS
  Filled 2015-05-12: qty 2

## 2015-05-12 MED ORDER — SODIUM CHLORIDE 0.9 % IV SOLN
INTRAVENOUS | Status: DC
  Administered 2015-05-12: 10 mL/h via INTRAVENOUS

## 2015-05-12 NOTE — ED Notes (Addendum)
Pt complaint of headache and nausea for months; went to UC and told to come her for evaluation of hypertension. Pt denies hx/treatment of hypertension.

## 2015-05-12 NOTE — ED Notes (Signed)
Pt refused blood draw. Stated "they said I could leave".

## 2015-05-12 NOTE — ED Notes (Signed)
Pt agrees to blood draw then discharge.

## 2015-05-12 NOTE — ED Notes (Signed)
Aimee Kerr Resides MD asked this Charge RN to call Pt regarding TSH result.  Left a VM asking the Pt to call me back. (2/6 @ 1825).

## 2015-05-12 NOTE — ED Provider Notes (Addendum)
CSN: [id number]     Arrival date & time 05/12/15  1148 History   First MD Initiated Contact with Patient 05/12/15 1155     Chief Complaint  Patient presents with  . Headache  . Hypertension     (Consider location/radiation/quality/duration/timing/severity/associated sxs/prior Treatment) HPI Comments: Patient here complaining of bifrontal headache times several months with one episode of emesis today. Does have a history of migraines and associated to similar. Went to urgent care center and was found to be hypertensive with a systolic blood pressure in the [id number] and diastolic [id number]. She denies any fever or chills. No chest pain or shortness of breath. She does not have any prior history of having high blood pressure. Has been using Imitrex for headache without resolve.  Patient is a 40 y.o. female presenting with headaches and hypertension. The history is provided by the patient.  Headache Hypertension Associated symptoms include headaches.    Past Medical History  Diagnosis Date  . Head ache   . Fibroids   . Migraines    Past Surgical History  Procedure Laterality Date  . Wisdom tooth extraction     No family history on file. Social History  Substance Use Topics  . Smoking status: Former Research scientist (life sciences)  . Smokeless tobacco: None  . Alcohol Use: No   OB History    No data available     Review of Systems  Neurological: Positive for headaches.  All other systems reviewed and are negative.     Allergies  Other; Formaldehyde; and Sulfa antibiotics  Home Medications   Prior to Admission medications   Medication Sig Start Date End Date Taking? Authorizing Provider  HYDROcodone-acetaminophen (NORCO) 5-325 MG per tablet Take 1 tablet by mouth every 6 (six) hours as needed for severe pain. 09/23/14   Mercedes Camprubi-Soms, PA-C  levonorgestrel-ethinyl estradiol (SEASONALE,INTROVALE,JOLESSA) 0.15-0.03 MG tablet Take 1 tablet by mouth daily.    Historical Provider, MD  loratadine  (CLARITIN) 10 MG tablet Take 10 mg by mouth daily.    Historical Provider, MD  Mefenamic Acid 250 MG CAPS Take 2 capsules (500 mg total) by mouth 3 (three) times daily. X 5 days or until bleeding stops 09/23/14   Mercedes Camprubi-Soms, PA-C  Multiple Vitamins-Minerals (MULTIVITAMINS THER. W/MINERALS) TABS Take 1 tablet by mouth daily.      Historical Provider, MD  ondansetron (ZOFRAN) 8 MG tablet Take 1 tablet (8 mg total) by mouth every 8 (eight) hours as needed for nausea or vomiting. 09/23/14   Mercedes Camprubi-Soms, PA-C   BP 148/103 mmHg  Pulse 115  Temp(Src) 98.8 F (37.1 C) (Oral)  Resp 18  Ht 5\' 4"  (1.626 m)  Wt 84.6 kg  BMI 32.00 kg/m2  SpO2 100%  LMP 05/08/2015 Physical Exam  Constitutional: She is oriented to person, place, and time. She appears well-developed and well-nourished.  Non-toxic appearance. No distress.  HENT:  Head: Normocephalic and atraumatic.  Eyes: Conjunctivae, EOM and lids are normal. Pupils are equal, round, and reactive to light.  Neck: Normal range of motion. Neck supple. No tracheal deviation present. No thyroid mass present.  Cardiovascular: Normal rate, regular rhythm and normal heart sounds.  Exam reveals no gallop.   No murmur heard. Pulmonary/Chest: Effort normal and breath sounds normal. No stridor. No respiratory distress. She has no decreased breath sounds. She has no wheezes. She has no rhonchi. She has no rales.  Abdominal: Soft. Normal appearance and bowel sounds are normal. She exhibits no distension. There is no tenderness. There is  no rebound and no CVA tenderness.  Musculoskeletal: Normal range of motion. She exhibits no edema or tenderness.  Neurological: She is alert and oriented to person, place, and time. She has normal strength. No cranial nerve deficit or sensory deficit. GCS eye subscore is 4. GCS verbal subscore is 5. GCS motor subscore is 6.  Skin: Skin is warm and dry. No abrasion and no rash noted.  Psychiatric: She has a normal  mood and affect. Her speech is normal and behavior is normal.  Nursing note and vitals reviewed.   ED Course  Procedures (including critical care time) Labs Review Labs Reviewed  CBC WITH DIFFERENTIAL/PLATELET  BASIC METABOLIC PANEL    Imaging Review No results found. I have personally reviewed and evaluated these images and lab results as part of my medical decision-making.   EKG Interpretation None      MDM   Final diagnoses:  None    Patient given medications for headache and feels better. Blood pressure noted and will be treated with hydrochlorothiazide. Given referral to primary care.    Lacretia Leigh, MD 05/12/15 [place], MD 05/12/15 [phone]

## 2015-05-12 NOTE — ED Provider Notes (Signed)
Patients with evidence of hypothyroidism based on elevated tsh. Spoke with charge RN, Thurmon Fair, and she will call the patient and inform her of these results the need for f/u  Lacretia Leigh, MD 05/12/15 [phone]

## 2015-05-12 NOTE — Discharge Instructions (Signed)
Hypertension Hypertension, commonly called high blood pressure, is when the force of blood pumping through your arteries is too strong. Your arteries are the blood vessels that carry blood from your heart throughout your body. A blood pressure reading consists of a higher number over a lower number, such as 110/72. The higher number (systolic) is the pressure inside your arteries when your heart pumps. The lower number (diastolic) is the pressure inside your arteries when your heart relaxes. Ideally you want your blood pressure below 120/80. Hypertension forces your heart to work harder to pump blood. Your arteries may become narrow or stiff. Having untreated or uncontrolled hypertension can cause heart attack, stroke, kidney disease, and other problems. RISK FACTORS Some risk factors for high blood pressure are controllable. Others are not.  Risk factors you cannot control include:   Race. You may be at higher risk if you are African American.  Age. Risk increases with age.  Gender. Men are at higher risk than women before age 45 years. After age 65, women are at higher risk than men. Risk factors you can control include:  Not getting enough exercise or physical activity.  Being overweight.  Getting too much fat, sugar, calories, or salt in your diet.  Drinking too much alcohol. SIGNS AND SYMPTOMS Hypertension does not usually cause signs or symptoms. Extremely high blood pressure (hypertensive crisis) may cause headache, anxiety, shortness of breath, and nosebleed. DIAGNOSIS To check if you have hypertension, your health care provider will measure your blood pressure while you are seated, with your arm held at the level of your heart. It should be measured at least twice using the same arm. Certain conditions can cause a difference in blood pressure between your right and left arms. A blood pressure reading that is higher than normal on one occasion does not mean that you need treatment. If  it is not clear whether you have high blood pressure, you may be asked to return on a different day to have your blood pressure checked again. Or, you may be asked to monitor your blood pressure at home for 1 or more weeks. TREATMENT Treating high blood pressure includes making lifestyle changes and possibly taking medicine. Living a healthy lifestyle can help lower high blood pressure. You may need to change some of your habits. Lifestyle changes may include:  Following the DASH diet. This diet is high in fruits, vegetables, and whole grains. It is low in salt, red meat, and added sugars.  Keep your sodium intake below 2,300 mg per day.  Getting at least 30-45 minutes of aerobic exercise at least 4 times per week.  Losing weight if necessary.  Not smoking.  Limiting alcoholic beverages.  Learning ways to reduce stress. Your health care provider may prescribe medicine if lifestyle changes are not enough to get your blood pressure under control, and if one of the following is true:  You are 18-59 years of age and your systolic blood pressure is above 140.  You are 60 years of age or older, and your systolic blood pressure is above 150.  Your diastolic blood pressure is above 90.  You have diabetes, and your systolic blood pressure is over 140 or your diastolic blood pressure is over 90.  You have kidney disease and your blood pressure is above 140/90.  You have heart disease and your blood pressure is above 140/90. Your personal target blood pressure may vary depending on your medical conditions, your age, and other factors. HOME CARE INSTRUCTIONS    Have your blood pressure rechecked as directed by your health care provider.   Take medicines only as directed by your health care provider. Follow the directions carefully. Blood pressure medicines must be taken as prescribed. The medicine does not work as well when you skip doses. Skipping doses also puts you at risk for  problems.  Do not smoke.   Monitor your blood pressure at home as directed by your health care provider. SEEK MEDICAL CARE IF:   You think you are having a reaction to medicines taken.  You have recurrent headaches or feel dizzy.  You have swelling in your ankles.  You have trouble with your vision. SEEK IMMEDIATE MEDICAL CARE IF:  You develop a severe headache or confusion.  You have unusual weakness, numbness, or feel faint.  You have severe chest or abdominal pain.  You vomit repeatedly.  You have trouble breathing. MAKE SURE YOU:   Understand these instructions.  Will watch your condition.  Will get help right away if you are not doing well or get worse.   This information is not intended to replace advice given to you by your health care provider. Make sure you discuss any questions you have with your health care provider.   Document Released: 03/22/2005 Document Revised: 08/06/2014 Document Reviewed: 01/12/2013 Elsevier Interactive Patient Education 2016 [place] Headache Without Cause A headache is pain or discomfort felt around the head or neck area. There are many causes and types of headaches. In some cases, the cause may not be found.  HOME CARE  Managing Pain  Take over-the-counter and prescription medicines only as told by your doctor.  Lie down in a dark, quiet room when you have a headache.  If directed, apply ice to the head and neck area:  Put ice in a plastic bag.  Place a towel between your skin and the bag.  Leave the ice on for 20 minutes, 2-3 times per day.  Use a heating pad or hot shower to apply heat to the head and neck area as told by your doctor.  Keep lights dim if bright lights bother you or make your headaches worse. Eating and Drinking  Eat meals on a regular schedule.  Lessen how much alcohol you drink.  Lessen how much caffeine you drink, or stop drinking caffeine. General Instructions  Keep all  follow-up visits as told by your doctor. This is important.  Keep a journal to find out if certain things bring on headaches. For example, write down:  What you eat and drink.  How much sleep you get.  Any change to your diet or medicines.  Relax by getting a massage or doing other relaxing activities.  Lessen stress.  Sit up straight. Do not tighten (tense) your muscles.  Do not use tobacco products. This includes cigarettes, chewing tobacco, or e-cigarettes. If you need help quitting, ask your doctor.  Exercise regularly as told by your doctor.  Get enough sleep. This often means 7-9 hours of sleep. GET HELP IF:  Your symptoms are not helped by medicine.  You have a headache that feels different than the other headaches.  You feel sick to your stomach (nauseous) or you throw up (vomit).  You have a fever. GET HELP RIGHT AWAY IF:   Your headache becomes really bad.  You keep throwing up.  You have a stiff neck.  You have trouble seeing.  You have trouble speaking.  You have pain in the eye or ear.  Your muscles are weak or you lose muscle control.  You lose your balance or have trouble walking.  You feel like you will pass out (faint) or you pass out.  You have confusion.   This information is not intended to replace advice given to you by your health care provider. Make sure you discuss any questions you have with your health care provider.   Document Released: 12/30/2007 Document Revised: 12/11/2014 Document Reviewed: 07/15/2014 Elsevier Interactive Patient Education [employer].

## 2015-05-14 LAB — T4: T4, Total: 10.5 ug/dL (ref 4.5–12.0)

## 2015-05-15 DIAGNOSIS — Z8619 Personal history of other infectious and parasitic diseases: Secondary | ICD-10-CM | POA: Insufficient documentation

## 2015-11-03 ENCOUNTER — Other Ambulatory Visit: Payer: Self-pay | Admitting: Obstetrics and Gynecology

## 2015-11-04 LAB — CYTOLOGY - PAP

## 2016-10-07 ENCOUNTER — Encounter (HOSPITAL_COMMUNITY): Payer: Self-pay | Admitting: Emergency Medicine

## 2016-10-07 ENCOUNTER — Emergency Department (HOSPITAL_COMMUNITY)
Admission: EM | Admit: 2016-10-07 | Discharge: 2016-10-07 | Disposition: A | Discharge status: Home / Self Care | Payer: Self-pay | Attending: Emergency Medicine | Admitting: Emergency Medicine

## 2016-10-07 ENCOUNTER — Emergency Department (HOSPITAL_COMMUNITY): Payer: Self-pay

## 2016-10-07 DIAGNOSIS — N898 Other specified noninflammatory disorders of vagina: Secondary | ICD-10-CM | POA: Insufficient documentation

## 2016-10-07 DIAGNOSIS — Z3490 Encounter for supervision of normal pregnancy, unspecified, unspecified trimester: Secondary | ICD-10-CM

## 2016-10-07 DIAGNOSIS — O26892 Other specified pregnancy related conditions, second trimester: Secondary | ICD-10-CM | POA: Insufficient documentation

## 2016-10-07 DIAGNOSIS — O26899 Other specified pregnancy related conditions, unspecified trimester: Secondary | ICD-10-CM

## 2016-10-07 DIAGNOSIS — Z79899 Other long term (current) drug therapy: Secondary | ICD-10-CM | POA: Insufficient documentation

## 2016-10-07 DIAGNOSIS — Z3A15 15 weeks gestation of pregnancy: Secondary | ICD-10-CM | POA: Insufficient documentation

## 2016-10-07 DIAGNOSIS — Z87891 Personal history of nicotine dependence: Secondary | ICD-10-CM | POA: Insufficient documentation

## 2016-10-07 LAB — URINALYSIS, ROUTINE W REFLEX MICROSCOPIC
Bilirubin Urine: NEGATIVE
Glucose, UA: NEGATIVE mg/dL
Hgb urine dipstick: NEGATIVE
KETONES UR: 5 mg/dL — AB
LEUKOCYTES UA: NEGATIVE
NITRITE: NEGATIVE
PH: 6 (ref 5.0–8.0)
Protein, ur: NEGATIVE mg/dL
SPECIFIC GRAVITY, URINE: 1.023 (ref 1.005–1.030)

## 2016-10-07 LAB — COMPREHENSIVE METABOLIC PANEL
ALT: 14 U/L (ref 14–54)
AST: 29 U/L (ref 15–41)
Albumin: 3.2 g/dL — ABNORMAL LOW (ref 3.5–5.0)
Alkaline Phosphatase: 64 U/L (ref 38–126)
Anion gap: 9 (ref 5–15)
BUN: 7 mg/dL (ref 6–20)
CALCIUM: 9 mg/dL (ref 8.9–10.3)
CHLORIDE: 105 mmol/L (ref 101–111)
CO2: 21 mmol/L — ABNORMAL LOW (ref 22–32)
Creatinine, Ser: 0.72 mg/dL (ref 0.44–1.00)
Glucose, Bld: 74 mg/dL (ref 65–99)
Potassium: 3.8 mmol/L (ref 3.5–5.1)
Sodium: 135 mmol/L (ref 135–145)
TOTAL PROTEIN: 7.1 g/dL (ref 6.5–8.1)
Total Bilirubin: 0.4 mg/dL (ref 0.3–1.2)

## 2016-10-07 LAB — WET PREP, GENITAL
CLUE CELLS WET PREP: NONE SEEN
Sperm: NONE SEEN
TRICH WET PREP: NONE SEEN
Yeast Wet Prep HPF POC: NONE SEEN

## 2016-10-07 LAB — CBC
HEMATOCRIT: 40.2 % (ref 36.0–46.0)
HEMOGLOBIN: 13.9 g/dL (ref 12.0–15.0)
MCH: 30.3 pg (ref 26.0–34.0)
MCHC: 34.6 g/dL (ref 30.0–36.0)
MCV: 87.8 fL (ref 78.0–100.0)
Platelets: 235 10*3/uL (ref 150–400)
RBC: 4.58 MIL/uL (ref 3.87–5.11)
RDW: 13.9 % (ref 11.5–15.5)
WBC: 6.7 10*3/uL (ref 4.0–10.5)

## 2016-10-07 LAB — HCG, QUANTITATIVE, PREGNANCY: HCG, BETA CHAIN, QUANT, S: 48407 m[IU]/mL — AB (ref <5)

## 2016-10-07 LAB — LIPASE, BLOOD

## 2016-10-07 NOTE — ED Provider Notes (Signed)
[facility] DEPT Provider Note   CSN: [id number] Arrival date & time: 10/07/16  1238     History   Chief Complaint Chief Complaint  Patient presents with  . Abdominal Pain  . Vaginal Discharge    HPI Aimee Kerr is a 41 y.o. female.  G2P0 presenting with intermittent abdominal cramping x1 week. States pain is sharp and stabbing, lasting a few seconds, then subsides. She states the sharp pains are located b/l lateral lower ribs, and cramping dull pains are in the suprapubic region. States pain is not associated with eating. She reports positive pregnancy test, with gestational age by LMP to be 15 weeks. She has not had prenatal care or an ultrasound. She states she does take daily prenatal vitamins. Reports intermittent N/V, on average 1 time per day. No vomiting today. Assoc symptoms include dull lower back pain and increased urinary frequency. Has not taken medications for pain. She reports a normal appetite. She also reports malodorous white vaginal discharge 2 weeks. She denies known exposure to STD, vaginal itching, dysuria. Denies dysuria, chest pain, shortness of breath, headache, or other symptoms. No history of abdominal surgeries.      Past Medical History:  Diagnosis Date  . Fibroids   . Head ache   . Migraines     There are no active problems to display for this patient.   Past Surgical History:  Procedure Laterality Date  . WISDOM TOOTH EXTRACTION      OB History    No data available       Home Medications    Prior to Admission medications   Medication Sig Start Date End Date Taking? Authorizing Provider  Multiple Vitamins-Minerals (MULTIVITAMINS THER. W/MINERALS) TABS Take 1 tablet by mouth daily.     Yes [provider]  Omega-3 Fatty Acids (FISH OIL) 1200 MG CAPS Take 1 capsule by mouth daily.   Yes [provider]  Prenatal Vit-Fe Fumarate-FA (MULTIVITAMIN-PRENATAL) 27-0.8 MG TABS tablet Take 1 tablet by mouth daily at 12  noon.   Yes [provider]  hydrochlorothiazide (HYDRODIURIL) 25 MG tablet Take 1 tablet (25 mg total) by mouth daily. Patient not taking: Reported on 10/07/2016 05/12/15   Lacretia Leigh, MD    Family History History reviewed. No pertinent family history.  Social History Social History  Substance Use Topics  . Smoking status: Former Research scientist (life sciences)  . Smokeless tobacco: Not on file  . Alcohol use No     Allergies   Other; Formaldehyde; and Sulfa antibiotics   Review of Systems Review of Systems  Constitutional: Negative for appetite change, chills and fever.  HENT: Negative for trouble swallowing.   Eyes: Negative for visual disturbance.  Respiratory: Negative for shortness of breath.   Cardiovascular: Negative for chest pain.  Gastrointestinal: Positive for abdominal pain, constipation, nausea and vomiting. Negative for diarrhea.  Genitourinary: Positive for frequency and vaginal discharge. Negative for dysuria and vaginal bleeding.  Musculoskeletal: Positive for back pain (low back pain).  Skin: Negative for color change.  Neurological: Negative for headaches.     Physical Exam Updated Vital Signs BP 132/79   Pulse 94   Temp 98.7 F (37.1 C) (Oral)   Resp (!) 22   SpO2 100%   Physical Exam  Constitutional: She appears well-developed and well-nourished.  Well-appearing, not in distress  HENT:  Head: Normocephalic and atraumatic.  Mouth/Throat: Oropharynx is clear and moist.  Eyes: Conjunctivae are normal.  Cardiovascular: Normal rate, regular rhythm, normal heart sounds and intact distal  pulses.  Exam reveals no friction rub.   No murmur heard. Pulmonary/Chest: Effort normal and breath sounds normal. No respiratory distress. She has no wheezes. She has no rales.  Right lateral lower ribs with mild TTP  Abdominal: Soft. Bowel sounds are normal. There is tenderness (mild LLQ tenderness. ). There is no rebound, no guarding and no CVA tenderness. No hernia.  Gravid  abdomen.  Genitourinary: There is no tenderness on the right labia. There is no tenderness on the left labia. Cervix exhibits no motion tenderness and no friability. Right adnexum displays no tenderness. Left adnexum displays no tenderness. No erythema in the vagina.  Genitourinary Comments: Exam performed with chaperone present. Moderate amount of white malodorous discharge. Uterus is gravid.   Neurological: She is alert.  Skin: Skin is warm.  Psychiatric: She has a normal mood and affect. Her behavior is normal.  Nursing note and vitals reviewed.    ED Treatments / Results  Labs (all labs ordered are listed, but only abnormal results are displayed) Labs Reviewed  WET PREP, GENITAL - Abnormal; Notable for the following:       Result Value   WBC, Wet Prep HPF POC FEW (*)    All other components within normal limits  URINALYSIS, ROUTINE W REFLEX MICROSCOPIC - Abnormal; Notable for the following:    Ketones, ur 5 (*)    All other components within normal limits  HCG, QUANTITATIVE, PREGNANCY - Abnormal; Notable for the following:    hCG, Beta Chain, Quant, S 48,407 (*)    All other components within normal limits  COMPREHENSIVE METABOLIC PANEL - Abnormal; Notable for the following:    CO2 21 (*)    Albumin 3.2 (*)    All other components within normal limits  LIPASE, BLOOD - Abnormal; Notable for the following:    Lipase <10 (*)    All other components within normal limits  CBC  HIV ANTIBODY (ROUTINE TESTING)  RPR  GC/CHLAMYDIA PROBE AMP ([place]) NOT AT [facility]    EKG  EKG Interpretation None       Radiology US Ob Limited  Result Date: 10/07/2016 CLINICAL DATA:  Vaginal discharge during pregnancy. Pelvic pain for 2 weeks. Gravida 2 para 0. LMP 06/23/2016. EDC by LMP is 03/30/2017. Gestational age by LMP is 15 weeks 1 day. EXAM: LIMITED OBSTETRIC ULTRASOUND COMPARISON:  09/23/2014 FINDINGS: Number of Fetuses: 1 Heart Rate:  150 bpm Movement: Present Presentation: Breech  Placental Location: Posterior Previa: Marginal Amniotic Fluid (Subjective):  Within normal limits. BPD:  2.95cm 15w  3d MATERNAL FINDINGS: Cervix:  Not assessed Uterus/Adnexae: Uterus is enlarged. Large fundal fibroid is 15.1 x 9.9 x 15.7 cm. A second fibroid measures 14.5 x 12.2 x 12.9 cm. IMPRESSION: 1. Single living intrauterine embryo in breech presentation. 2. Limited biometry confirms clinical EDC of 03/30/2017. 3. Large fibroids, 15.1 and 14.5 cm. This exam is performed on an emergent basis and does not comprehensively evaluate fetal size, dating, or anatomy; follow-up complete OB US should be considered if further fetal assessment is warranted. Electronically Signed   By: Nolon Nations M.D.   On: 10/07/2016 16:01    Procedures Procedures (including critical care time)  Medications Ordered in ED Medications - No data to display   Initial Impression / Assessment and Plan / ED Course  I have reviewed the triage vital signs and the nursing notes.  Pertinent labs & imaging results that were available during my care of the patient were reviewed by me and  considered in my medical decision making (see chart for details).     Pt w b/l and suprapubic abdominal pain. Pt with confirmed intrauterine pregnancy. Labs unremarkable. Pelvic exam without CMT or tenderness, moderate amount of white discharge. Wet prep with few WBC and no bacteria or yeast. GC/Chlamydia, HIV and RPR pending. Patient is nontoxic, nonseptic appearing, in no apparent distress.  Patient's pain and other symptoms adequately managed in emergency department.   Labs, imaging and vitals reviewed.  Patient does not meet the SIRS or Sepsis criteria.  On repeat exam patient does not have a surgical abdomen and there are no peritoneal signs. Pt aware of her pending STI labs. Patient discharged home with symptomatic treatment and given strict instructions to establish prenatal care. Women's clinic referral given.  Pt safe for  discharge.  Patient discussed with and seen by Dr. Winfred Leeds.  Discussed results, findings, treatment and follow up. Patient advised of return precautions. Patient verbalized understanding and agreed with plan.  Final Clinical Impressions(s) / ED Diagnoses   Final diagnoses:  Intrauterine pregnancy  Vaginal discharge during pregnancy in second trimester    New Prescriptions New Prescriptions   No medications on file     Russo, [place] N, PA-C 10/07/16 1644    Orlie Dakin, MD 10/07/16 [phone]

## 2016-10-07 NOTE — ED Provider Notes (Addendum)
Patient complains of low abdominal cramping intermittent for one week accompanied by vomiting approximately one time per day. She's had no nausea present and no vomiting today she denies any fever other associated symptoms include vaginal discharge for one week. She is presently pregnant. On exam she is alert and in no distress lungs clear auscultation heart regular rate and rhythm abdomen gravid, nontender   Orlie Dakin, MD 10/07/16 1638 Patient has sent written request that I amend the medical record. Her request is dated 10/25/16. Here requests: 1. HCTZ should be removed from her medication list, as she's never taken that medication. 2.MVI with minerals is not a current medicationand should be removed.  3. She is not a patient of [facility] in years. Her current PCP is Beatrix Fetters, FNP. She would like that information to be updated.  I served as Heritage manager on this case and did not verify those 3 facts specifically with her. However, in good faith, I do not have any reason to doubt the veracity of those 3 items and therefore agree to amend the record   Orlie Dakin, MD 12/15/16 1011

## 2016-10-07 NOTE — ED Notes (Signed)
Ultrasound at bedside

## 2016-10-07 NOTE — Discharge Instructions (Signed)
Please read instructions below. Continue taking prenatal vitamins daily. Drink plenty of water. Schedule an appointment with the [facility] to establish prenatal care. Return to the ER for vaginal bleeding, worsening pain, or new or concerning symptoms.

## 2016-10-07 NOTE — ED Triage Notes (Signed)
Pt reports lateral abd pain for the past week. Also began having foul smelling clear/white vaginal discharge a few days ago. No new n/v although she has had issues with morning sickness.  Pt is [redacted] weeks pregnant.

## 2016-10-07 NOTE — ED Notes (Signed)
ED Provider at bedside. 

## 2016-10-08 LAB — RPR: RPR Ser Ql: NONREACTIVE

## 2016-10-08 LAB — HIV ANTIBODY (ROUTINE TESTING W REFLEX): HIV SCREEN 4TH GENERATION: NONREACTIVE

## 2016-10-08 LAB — GC/CHLAMYDIA PROBE AMP (~~LOC~~) NOT AT ARMC
CHLAMYDIA, DNA PROBE: NEGATIVE
Neisseria Gonorrhea: NEGATIVE

## 2016-11-04 LAB — OB RESULTS CONSOLE GC/CHLAMYDIA
CHLAMYDIA, DNA PROBE: NEGATIVE
Gonorrhea: NEGATIVE

## 2016-11-04 LAB — OB RESULTS CONSOLE HIV ANTIBODY (ROUTINE TESTING): HIV: NONREACTIVE

## 2016-11-04 LAB — OB RESULTS CONSOLE HEPATITIS B SURFACE ANTIGEN: HEP B S AG: NEGATIVE

## 2016-11-04 LAB — OB RESULTS CONSOLE RPR: RPR: NONREACTIVE

## 2016-11-04 LAB — OB RESULTS CONSOLE RUBELLA ANTIBODY, IGM: RUBELLA: IMMUNE

## 2016-11-04 LAB — OB RESULTS CONSOLE ABO/RH: RH Type: POSITIVE

## 2016-11-04 LAB — OB RESULTS CONSOLE ANTIBODY SCREEN: Antibody Screen: NEGATIVE

## 2017-01-26 ENCOUNTER — Emergency Department (HOSPITAL_COMMUNITY)
Admission: EM | Admit: 2017-01-26 | Discharge: 2017-01-26 | Disposition: A | Discharge status: Home / Self Care | Payer: BLUE CROSS/BLUE SHIELD | Attending: Emergency Medicine | Admitting: Emergency Medicine

## 2017-01-26 ENCOUNTER — Other Ambulatory Visit: Payer: Self-pay

## 2017-01-26 ENCOUNTER — Encounter: Payer: Self-pay | Admitting: Emergency Medicine

## 2017-01-26 DIAGNOSIS — R1030 Lower abdominal pain, unspecified: Secondary | ICD-10-CM | POA: Diagnosis not present

## 2017-01-26 DIAGNOSIS — R079 Chest pain, unspecified: Secondary | ICD-10-CM | POA: Diagnosis present

## 2017-01-26 DIAGNOSIS — R0789 Other chest pain: Secondary | ICD-10-CM | POA: Diagnosis not present

## 2017-01-26 DIAGNOSIS — Z79899 Other long term (current) drug therapy: Secondary | ICD-10-CM | POA: Insufficient documentation

## 2017-01-26 DIAGNOSIS — Z87891 Personal history of nicotine dependence: Secondary | ICD-10-CM | POA: Insufficient documentation

## 2017-01-26 LAB — CBC WITH DIFFERENTIAL/PLATELET
BASOS ABS: 0 10*3/uL (ref 0.0–0.1)
Basophils Relative: 0 %
Eosinophils Absolute: 0 10*3/uL (ref 0.0–0.7)
Eosinophils Relative: 0 %
HEMATOCRIT: 38.4 % (ref 36.0–46.0)
HEMOGLOBIN: 12.9 g/dL (ref 12.0–15.0)
LYMPHS PCT: 17 %
Lymphs Abs: 1 10*3/uL (ref 0.7–4.0)
MCH: 30.1 pg (ref 26.0–34.0)
MCHC: 33.6 g/dL (ref 30.0–36.0)
MCV: 89.5 fL (ref 78.0–100.0)
MONO ABS: 0.4 10*3/uL (ref 0.1–1.0)
MONOS PCT: 6 %
NEUTROS ABS: 4.6 10*3/uL (ref 1.7–7.7)
NEUTROS PCT: 77 %
Platelets: 151 10*3/uL (ref 150–400)
RBC: 4.29 MIL/uL (ref 3.87–5.11)
RDW: 14.2 % (ref 11.5–15.5)
WBC: 5.9 10*3/uL (ref 4.0–10.5)

## 2017-01-26 LAB — COMPREHENSIVE METABOLIC PANEL
ALK PHOS: 59 U/L (ref 38–126)
ALT: 19 U/L (ref 14–54)
AST: 22 U/L (ref 15–41)
Albumin: 2.8 g/dL — ABNORMAL LOW (ref 3.5–5.0)
Anion gap: 9 (ref 5–15)
BILIRUBIN TOTAL: 0.6 mg/dL (ref 0.3–1.2)
BUN: 6 mg/dL (ref 6–20)
CO2: 22 mmol/L (ref 22–32)
Calcium: 8.6 mg/dL — ABNORMAL LOW (ref 8.9–10.3)
Chloride: 104 mmol/L (ref 101–111)
Creatinine, Ser: 0.6 mg/dL (ref 0.44–1.00)
GFR calc Af Amer: >60 mL/min (ref >60)
Glucose, Bld: 81 mg/dL (ref 65–99)
Potassium: 3.7 mmol/L (ref 3.5–5.1)
Sodium: 135 mmol/L (ref 135–145)
Total Protein: 6.1 g/dL — ABNORMAL LOW (ref 6.5–8.1)

## 2017-01-26 LAB — I-STAT TROPONIN, ED: Troponin i, poc: 0.01 ng/mL (ref 0.00–0.08)

## 2017-01-26 LAB — URINALYSIS, ROUTINE W REFLEX MICROSCOPIC
Bilirubin Urine: NEGATIVE
GLUCOSE, UA: NEGATIVE mg/dL
Hgb urine dipstick: NEGATIVE
KETONES UR: NEGATIVE mg/dL
LEUKOCYTES UA: NEGATIVE
NITRITE: NEGATIVE
PH: 8 (ref 5.0–8.0)
Protein, ur: NEGATIVE mg/dL
SPECIFIC GRAVITY, URINE: 1.009 (ref 1.005–1.030)

## 2017-01-26 LAB — LIPASE, BLOOD: LIPASE: 14 U/L (ref 11–51)

## 2017-01-26 MED ORDER — SODIUM CHLORIDE 0.9 % IV BOLUS (SEPSIS)
1000.0000 mL | Freq: Once | INTRAVENOUS | Status: AC
  Administered 2017-01-26: 1000 mL via INTRAVENOUS

## 2017-01-26 NOTE — ED Triage Notes (Signed)
Pt had an argument with husband last night and had a headache and abd cramping. This morning pt stated she felt a heaviness in her chest and has had slight difficulty breathing.

## 2017-01-26 NOTE — ED Notes (Signed)
ED Provider at bedside. 

## 2017-01-26 NOTE — ED Notes (Signed)
Got patient undress on the monitor did ekg shown to Dr Rees patient is resting with call bell in reach °

## 2017-01-26 NOTE — ED Notes (Signed)
OB Rapid Response RN at bedside. 

## 2017-01-26 NOTE — ED Notes (Signed)
Pt up to bathroom.

## 2017-01-26 NOTE — ED Provider Notes (Signed)
[facility] EMERGENCY DEPARTMENT Provider Note   CSN: [id number] Arrival date & time: 01/26/17  0831     History   Chief Complaint Chief Complaint  Patient presents with  . Chest Pain  . Abdominal Pain    HPI Aimee Kerr is a 41 y.o. female.  The history is provided by the patient. No language interpreter was used.  Chest Pain   Associated symptoms include abdominal pain.  Abdominal Pain     Aimee Kerr is a 41 y.o. female who presents to the Emergency Department complaining of chest pain, abdominal pain.  She is [redacted] weeks pregnant with due date of December 26.  She did have an argument with her husband last night and developed mild headache, shortness of breath and lower abdominal cramping.  This morning she had slight worsening in her shortness of breath with chest tightness.  She called the on-call nurse and was told to call 911.  She denies any fevers, cough, vomiting, leg swelling or pain.  No prior similar symptoms.  She has no medical problems and takes only prenatal vitamins.  She is feeling the baby move.  No vaginal bleeding or leakage of fluids.  Abdominal pain last night was constant in nature today it is constant and improving. Past Medical History:  Diagnosis Date  . Fibroids   . Head ache   . Migraines     There are no active problems to display for this patient.   Past Surgical History:  Procedure Laterality Date  . WISDOM TOOTH EXTRACTION      OB History    Gravida Para Term Preterm AB Living   2       1 0   SAB TAB Ectopic Multiple Live Births   1               Home Medications    Prior to Admission medications   Medication Sig Start Date End Date Taking? Authorizing Provider  hydrochlorothiazide (HYDRODIURIL) 25 MG tablet Take 1 tablet (25 mg total) by mouth daily. Patient not taking: Reported on 10/07/2016 05/12/15   Lacretia Leigh, MD  Multiple Vitamins-Minerals (MULTIVITAMINS THER. W/MINERALS) TABS Take 1 tablet by mouth  daily.      [provider]  Omega-3 Fatty Acids (FISH OIL) 1200 MG CAPS Take 1 capsule by mouth daily.    [provider]  Prenatal Vit-Fe Fumarate-FA (MULTIVITAMIN-PRENATAL) 27-0.8 MG TABS tablet Take 1 tablet by mouth daily at 12 noon.    [provider]    Family History No family history on file.  Social History Social History  Substance Use Topics  . Smoking status: Former Research scientist (life sciences)  . Smokeless tobacco: Not on file  . Alcohol use No     Allergies   Other; Asa [aspirin]; Formaldehyde; and Sulfa antibiotics   Review of Systems Review of Systems  Cardiovascular: Positive for chest pain.  Gastrointestinal: Positive for abdominal pain.  All other systems reviewed and are negative.    Physical Exam Updated Vital Signs BP 108/73   Pulse 80   Temp 97.6 F (36.4 C)   Resp 15   Ht 5\' 2"  (1.575 m)   Wt 88 kg (194 lb)   LMP 06/23/2016 (Exact Date)   SpO2 100%   BMI 35.48 kg/m   Physical Exam  Constitutional: She is oriented to person, place, and time. She appears well-developed and well-nourished.  HENT:  Head: Normocephalic and atraumatic.  Cardiovascular: Regular rhythm.   No murmur heard.  Tachycardic  Pulmonary/Chest: Effort normal and breath sounds normal. No respiratory distress.  Abdominal: There is no rebound and no guarding.  Gravid uterus, mild diffuse abdominal tenderness  Musculoskeletal: She exhibits no edema or tenderness.  Neurological: She is alert and oriented to person, place, and time.  Skin: Skin is warm and dry.  Psychiatric: She has a normal mood and affect. Her behavior is normal.  Nursing note and vitals reviewed.    ED Treatments / Results  Labs (all labs ordered are listed, but only abnormal results are displayed) Labs Reviewed  COMPREHENSIVE METABOLIC PANEL - Abnormal; Notable for the following:       Result Value   Calcium 8.6 (*)    Total Protein 6.1 (*)    Albumin 2.8 (*)    All other components  within normal limits  URINALYSIS, ROUTINE W REFLEX MICROSCOPIC - Abnormal; Notable for the following:    APPearance HAZY (*)    All other components within normal limits  LIPASE, BLOOD  CBC WITH DIFFERENTIAL/PLATELET  I-STAT TROPONIN, ED    EKG  EKG Interpretation  Date/Time:  Wednesday January 26 2017 08:37:49 EDT Ventricular Rate:  92 PR Interval:    QRS Duration: 90 QT Interval:  369 QTC Calculation: 457 R Axis:   21 Text Interpretation:  Sinus rhythm Probable left atrial enlargement Confirmed by Quintella Reichert [phone]) on 01/26/2017 8:43:05 AM Also confirmed by Quintella Reichert [phone]), editor Laurena Spies [phone])  on 01/26/2017 9:19:43 AM       Radiology No results found.  Procedures Procedures (including critical care time)  Medications Ordered in ED Medications  sodium chloride 0.9 % bolus 1,000 mL (1,000 mLs Intravenous New Bag/Given 01/26/17 0947)     Initial Impression / Assessment and Plan / ED Course  I have reviewed the triage vital signs and the nursing notes.  Pertinent labs & imaging results that were available during my care of the patient were reviewed by me and considered in my medical decision making (see chart for details).     Patient [redacted] weeks pregnant here with shortness of breath, headache, abdominal pain.  She is well-appearing on examination in no acute distress.  Fetal tracings are reassuring with no active contractions in the emergency department.  With observation her shortness of breath and chest tightness resolved.  Presentation is not consistent with reactive airway disease, PE, pneumonia, ACS, dissection, acute abdomen, preterm labor.  Discussed with patient home care for chest discomfort and shortness of breath as well as abdominal pain, question mild dehydration and stress.  Discussed outpatient follow-up and return precautions.  Final Clinical Impressions(s) / ED Diagnoses   Final diagnoses:  Atypical chest pain  Lower abdominal  pain    New Prescriptions New Prescriptions   No medications on file     Quintella Reichert, MD 01/26/17 1057

## 2017-01-26 NOTE — Progress Notes (Signed)
Called to come to [facility] ED for a G2 P0 31.[redacted] weeks pregnant complaining of chest pain. PT was placed on fetal monitor, FHR tracing reactive and reassuring. Pt deines any OB complaints and states she feels baby moving and had a prenatal visit on 10/17. Called Dr Matthew Saras, informed of pt status, FHR tracing, OB cleared. ED doctor informed.

## 2017-01-31 ENCOUNTER — Inpatient Hospital Stay (HOSPITAL_COMMUNITY)
Admission: AD | Admit: 2017-01-31 | Discharge: 2017-01-31 | Disposition: A | Discharge status: Home / Self Care | Payer: BLUE CROSS/BLUE SHIELD | Source: Ambulatory Visit | Attending: Obstetrics and Gynecology | Admitting: Obstetrics and Gynecology

## 2017-01-31 ENCOUNTER — Encounter (HOSPITAL_COMMUNITY): Payer: Self-pay | Admitting: Student

## 2017-01-31 DIAGNOSIS — Z8249 Family history of ischemic heart disease and other diseases of the circulatory system: Secondary | ICD-10-CM | POA: Diagnosis not present

## 2017-01-31 DIAGNOSIS — R51 Headache: Secondary | ICD-10-CM | POA: Diagnosis not present

## 2017-01-31 DIAGNOSIS — Z3A31 31 weeks gestation of pregnancy: Secondary | ICD-10-CM | POA: Diagnosis not present

## 2017-01-31 DIAGNOSIS — Z882 Allergy status to sulfonamides status: Secondary | ICD-10-CM | POA: Insufficient documentation

## 2017-01-31 DIAGNOSIS — Z886 Allergy status to analgesic agent status: Secondary | ICD-10-CM | POA: Diagnosis not present

## 2017-01-31 DIAGNOSIS — R350 Frequency of micturition: Secondary | ICD-10-CM | POA: Diagnosis not present

## 2017-01-31 DIAGNOSIS — Z87891 Personal history of nicotine dependence: Secondary | ICD-10-CM | POA: Insufficient documentation

## 2017-01-31 DIAGNOSIS — O3413 Maternal care for benign tumor of corpus uteri, third trimester: Secondary | ICD-10-CM | POA: Diagnosis not present

## 2017-01-31 DIAGNOSIS — R109 Unspecified abdominal pain: Secondary | ICD-10-CM | POA: Insufficient documentation

## 2017-01-31 DIAGNOSIS — O09523 Supervision of elderly multigravida, third trimester: Secondary | ICD-10-CM | POA: Insufficient documentation

## 2017-01-31 DIAGNOSIS — D259 Leiomyoma of uterus, unspecified: Secondary | ICD-10-CM | POA: Insufficient documentation

## 2017-01-31 DIAGNOSIS — O26893 Other specified pregnancy related conditions, third trimester: Secondary | ICD-10-CM

## 2017-01-31 LAB — URINALYSIS, ROUTINE W REFLEX MICROSCOPIC
Bilirubin Urine: NEGATIVE
GLUCOSE, UA: NEGATIVE mg/dL
HGB URINE DIPSTICK: NEGATIVE
KETONES UR: NEGATIVE mg/dL
NITRITE: NEGATIVE
PH: 6 (ref 5.0–8.0)
PROTEIN: NEGATIVE mg/dL
SPECIFIC GRAVITY, URINE: 1.011 (ref 1.005–1.030)

## 2017-01-31 NOTE — MAU Provider Note (Signed)
History     CSN: [id number]  Arrival date and time: 01/31/17 7253   First Provider Initiated Contact with Patient 01/31/17 [phone]      Chief Complaint  Patient presents with  . Abdominal Pain  . Headache   HPI Keviana Guida is a 41 y.o. G2P0010 at [redacted]w[redacted]d who presents with pelvic pressure & headache. Symptoms began last night. Reports intermittent pelvic pressure & abdominal pain that occurs every 30 minutes. Rates pain 7/10. Has not treated. Denies vaginal bleeding or LOF. Positive fetal movement. No recent intercourse. Has 2 large fibroids (14 cm & 15 cm per July Scan) that she states have gotten bigger per scans in the office.  Headache since 2 am. States she gets frequent headaches since before pregnancy & that this feels like those headaches. Describes as frontal headache she rates 4/10. States she does not treat her headaches & they resolve spontaneously. Denies visual disturbance or epigastric pain. Increased urinary frequency since yesterday. Denies dysuria, hematuria, urgency, or flank pain.   OB History    Gravida Para Term Preterm AB Living   2       1 0   SAB TAB Ectopic Multiple Live Births   1              Past Medical History:  Diagnosis Date  . Fibroids   . Migraines     Past Surgical History:  Procedure Laterality Date  . WISDOM TOOTH EXTRACTION      Family History  Problem Relation Age of Onset  . Heart disease Mother     Social History  Substance Use Topics  . Smoking status: Former Research scientist (life sciences)  . Smokeless tobacco: Never Used  . Alcohol use No    Allergies:  Allergies  Allergen Reactions  . Other Hives and Rash    Some Seafoods--Severe rash and breakout.  Diona Fanti [Aspirin] Hives  . Formaldehyde Hives and Rash  . Sulfa Antibiotics Hives and Rash    Prescriptions Prior to Admission  Medication Sig Dispense Refill Last Dose  . Omega-3 Fatty Acids (FISH OIL) 1200 MG CAPS Take 1 capsule by mouth daily.   01/30/2017 at Unknown time  . Prenatal Vit-Fe  Fumarate-FA (MULTIVITAMIN-PRENATAL) 27-0.8 MG TABS tablet Take 1 tablet by mouth daily at 12 noon.   01/30/2017 at Unknown time    Review of Systems  Constitutional: Negative.   Eyes: Negative for photophobia and visual disturbance.  Gastrointestinal: Positive for abdominal pain. Negative for constipation, diarrhea, nausea and vomiting.  Genitourinary: Positive for frequency and pelvic pain. Negative for dysuria, flank pain, hematuria, vaginal bleeding and vaginal discharge.  Neurological: Positive for headaches.   Physical Exam   Blood pressure 122/82, pulse (!) 105, temperature 97.7 F (36.5 C), temperature source Oral, resp. rate 16, height 5\' 2"  (1.575 m), weight 194 lb (88 kg), last menstrual period 06/23/2016, SpO2 100 %.  Physical Exam  Nursing note and vitals reviewed. Constitutional: She is oriented to person, place, and time. She appears well-developed and well-nourished. No distress.  HENT:  Head: Normocephalic and atraumatic.  Eyes: Conjunctivae are normal. Right eye exhibits no discharge. Left eye exhibits no discharge. No scleral icterus.  Neck: Normal range of motion.  Cardiovascular: Normal rate, regular rhythm and normal heart sounds.   No murmur heard. Respiratory: Effort normal and breath sounds normal. No respiratory distress. She has no wheezes.  GI: Soft. There is no tenderness.  Ctx palpate mild with adequate resting tone  Genitourinary:  Genitourinary Comments: Dilation: Closed  Effacement (%): Thick Cervical Position: Posterior Exam by:: Jorje Guild NP   Neurological: She is alert and oriented to person, place, and time.  Skin: Skin is warm and dry. She is not diaphoretic.  Psychiatric: She has a normal mood and affect. Her behavior is normal. Judgment and thought content normal.   Fetal Tracing:  Baseline: 130 Variability: moderate Accelerations: 15x15 Decelerations: none  Toco: Q10-15 w/UI MAU Course  Procedures Results for orders placed or  performed during the hospital encounter of 01/31/17 (from the past 48 hour(s))  Urinalysis, Routine w reflex microscopic     Status: Abnormal   Collection Time: 01/31/17  7:00 AM  Result Value Ref Range   Color, Urine YELLOW YELLOW   APPearance HAZY (A) CLEAR   Specific Gravity, Urine 1.011 1.005 - 1.030   pH 6.0 5.0 - 8.0   Glucose, UA NEGATIVE NEGATIVE mg/dL   Hgb urine dipstick NEGATIVE NEGATIVE   Bilirubin Urine NEGATIVE NEGATIVE   Ketones, ur NEGATIVE NEGATIVE mg/dL   Protein, ur NEGATIVE NEGATIVE mg/dL   Nitrite NEGATIVE NEGATIVE   Leukocytes, UA TRACE (A) NEGATIVE   RBC / HPF 0-5 0 - 5 RBC/hpf   WBC, UA 0-5 0 - 5 WBC/hpf   Bacteria, UA MANY (A) NONE SEEN   Squamous Epithelial / LPF 0-5 (A) NONE SEEN   Mucus PRESENT   Culture, OB Urine     Status: Abnormal   Collection Time: 01/31/17  9:00 AM  Result Value Ref Range   Specimen Description OB CLEAN CATCH    Special Requests NONE    Culture (A)     MULTIPLE SPECIES PRESENT, SUGGEST RECOLLECTION NO GROUP B STREP (S.AGALACTIAE) ISOLATED Performed at [facility], [address], [place],  [address]    Report Status 02/01/2017 FINAL     MDM Reactive NST Irregular ctx & UI noted on TOCO. Cervix closed/thick/posterior Patient declines medication to treat headache. Normotensive.  U/a with many bacteria on micro -- will send for culture Discussed results with Dr. Royston Sinner. Ok to discharge home. Patient has f/u in office tomorrow morning.  Assessment and Plan  A: 1. Uterine fibroids affecting pregnancy in third trimester   2. [redacted] weeks gestation of pregnancy   3. Pregnancy headache in third trimester    P: Discharge home PTL precautions Increase water intake Keep f/u with OB tomorrow  Jorje Guild 01/31/2017, 8:59 AM

## 2017-01-31 NOTE — MAU Note (Signed)
Presents to MAU c/o pelvic pressure/ cramping since 0200.  Increase urinary frequency.  Headache starting at 0200.  Denies Vaginal bleeding/ discharge.

## 2017-01-31 NOTE — Discharge Instructions (Signed)
Preterm Labor and Birth Information The normal length of a pregnancy is 39-41 weeks. Preterm labor is when labor starts before 37 completed weeks of pregnancy. What are the risk factors for preterm labor? Preterm labor is more likely to occur in women who:  Have certain infections during pregnancy such as a bladder infection, sexually transmitted infection, or infection inside the uterus (chorioamnionitis).  Have a shorter-than-normal cervix.  Have gone into preterm labor before.  Have had surgery on their cervix.  Are younger than age 89 or older than age 19.  Are African American.  Are pregnant with twins or multiple babies (multiple gestation).  Take street drugs or smoke while pregnant.  Do not gain enough weight while pregnant.  Became pregnant shortly after having been pregnant.  What are the symptoms of preterm labor? Symptoms of preterm labor include:  Cramps similar to those that can happen during a menstrual period. The cramps may happen with diarrhea.  Pain in the abdomen or lower back.  Regular uterine contractions that may feel like tightening of the abdomen.  A feeling of increased pressure in the pelvis.  Increased watery or bloody mucus discharge from the vagina.  Water breaking (ruptured amniotic sac).  Why is it important to recognize signs of preterm labor? It is important to recognize signs of preterm labor because babies who are born prematurely may not be fully developed. This can put them at an increased risk for:  Long-term (chronic) heart and lung problems.  Difficulty immediately after birth with regulating body systems, including blood sugar, body temperature, heart rate, and breathing rate.  Bleeding in the brain.  Cerebral palsy.  Learning difficulties.  Death.  These risks are highest for babies who are born before 37 weeks of pregnancy. How is preterm labor treated? Treatment depends on the length of your pregnancy, your  condition, and the health of your baby. It may involve:  Having a stitch (suture) placed in your cervix to prevent your cervix from opening too early (cerclage).  Taking or being given medicines, such as: ? Hormone medicines. These may be given early in pregnancy to help support the pregnancy. ? Medicine to stop contractions. ? Medicines to help mature the babys lungs. These may be prescribed if the risk of delivery is high. ? Medicines to prevent your baby from developing cerebral palsy.  If the labor happens before 34 weeks of pregnancy, you may need to stay in the hospital. What should I do if I think I am in preterm labor? If you think that you are going into preterm labor, call your health care provider right away. How can I prevent preterm labor in future pregnancies? To increase your chance of having a full-term pregnancy:  Do not use any tobacco products, such as cigarettes, chewing tobacco, and e-cigarettes. If you need help quitting, ask your health care provider.  Do not use street drugs or medicines that have not been prescribed to you during your pregnancy.  Talk with your health care provider before taking any herbal supplements, even if you have been taking them regularly.  Make sure you gain a healthy amount of weight during your pregnancy.  Watch for infection. If you think that you might have an infection, get it checked right away.  Make sure to tell your health care provider if you have gone into preterm labor before.  This information is not intended to replace advice given to you by your health care provider. Make sure you discuss any questions  you have with your health care provider. °Document Released: 06/12/2003 Document Revised: 09/02/2015 Document Reviewed: 08/13/2015 °Elsevier Interactive Patient Education © 2018 Elsevier Inc[employer] ° ° ° ° °Fetal Movement Counts °Patient Name: ________________________________________________ Patient Due Date:  ____________________ °What is a fetal movement count? °A fetal movement count is the number of times that you feel your baby move during a certain amount of time. This may also be called a fetal kick count. A fetal movement count is recommended for every pregnant woman. You may be asked to start counting fetal movements as early as week 28 of your pregnancy. °Pay attention to when your baby is most active. You may notice your baby's sleep and wake cycles. You may also notice things that make your baby move more. You should do a fetal movement count: °· When your baby is normally most active. °· At the same time each day. ° °A good time to count movements is while you are resting, after having something to eat and drink. °How do I count fetal movements? °1. Find a quiet, comfortable area. Sit, or lie down on your side. °2. Write down the date, the start time and stop time, and the number of movements that you felt between those two times. Take this information with you to your health care visits. °3. For 2 hours, count kicks, flutters, swishes, rolls, and jabs. You should feel at least 10 movements during 2 hours. °4. You may stop counting after you have felt 10 movements. °5. If you do not feel 10 movements in 2 hours, have something to eat and drink. Then, keep resting and counting for 1 hour. If you feel at least 4 movements during that hour, you may stop counting. °Contact a health care provider if: °· You feel fewer than 4 movements in 2 hours. °· Your baby is not moving like he or she usually does. °Date: ____________ Start time: ____________ Stop time: ____________ Movements: ____________ °Date: ____________ Start time: ____________ Stop time: ____________ Movements: ____________ °Date: ____________ Start time: ____________ Stop time: ____________ Movements: ____________ °Date: ____________ Start time: ____________ Stop time: ____________ Movements: ____________ °Date: ____________ Start time: ____________  Stop time: ____________ Movements: ____________ °Date: ____________ Start time: ____________ Stop time: ____________ Movements: ____________ °Date: ____________ Start time: ____________ Stop time: ____________ Movements: ____________ °Date: ____________ Start time: ____________ Stop time: ____________ Movements: ____________ °Date: ____________ Start time: ____________ Stop time: ____________ Movements: ____________ °This information is not intended to replace advice given to you by your health care provider. Make sure you discuss any questions you have with your health care provider. °Document Released: 04/21/2006 Document Revised: 11/19/2015 Document Reviewed: 05/01/2015 °Elsevier Interactive Patient Education © 2018 Elsevier Inc. ° °

## 2017-02-01 LAB — CULTURE, OB URINE

## 2017-02-04 ENCOUNTER — Inpatient Hospital Stay (HOSPITAL_COMMUNITY)
Admission: AD | Admit: 2017-02-04 | Discharge: 2017-02-04 | Disposition: A | Discharge status: Home / Self Care | Payer: BLUE CROSS/BLUE SHIELD | Source: Ambulatory Visit | Attending: Obstetrics and Gynecology | Admitting: Obstetrics and Gynecology

## 2017-02-04 ENCOUNTER — Inpatient Hospital Stay (HOSPITAL_COMMUNITY): Payer: BLUE CROSS/BLUE SHIELD

## 2017-02-04 ENCOUNTER — Encounter (HOSPITAL_COMMUNITY): Payer: Self-pay

## 2017-02-04 DIAGNOSIS — Z3A32 32 weeks gestation of pregnancy: Secondary | ICD-10-CM | POA: Insufficient documentation

## 2017-02-04 DIAGNOSIS — Z87891 Personal history of nicotine dependence: Secondary | ICD-10-CM | POA: Insufficient documentation

## 2017-02-04 DIAGNOSIS — Z882 Allergy status to sulfonamides status: Secondary | ICD-10-CM | POA: Diagnosis not present

## 2017-02-04 DIAGNOSIS — Z886 Allergy status to analgesic agent status: Secondary | ICD-10-CM | POA: Insufficient documentation

## 2017-02-04 DIAGNOSIS — O3413 Maternal care for benign tumor of corpus uteri, third trimester: Secondary | ICD-10-CM | POA: Diagnosis not present

## 2017-02-04 DIAGNOSIS — R079 Chest pain, unspecified: Secondary | ICD-10-CM | POA: Insufficient documentation

## 2017-02-04 DIAGNOSIS — O26893 Other specified pregnancy related conditions, third trimester: Secondary | ICD-10-CM | POA: Diagnosis not present

## 2017-02-04 DIAGNOSIS — Z8249 Family history of ischemic heart disease and other diseases of the circulatory system: Secondary | ICD-10-CM | POA: Insufficient documentation

## 2017-02-04 DIAGNOSIS — R0602 Shortness of breath: Secondary | ICD-10-CM | POA: Diagnosis present

## 2017-02-04 DIAGNOSIS — D259 Leiomyoma of uterus, unspecified: Secondary | ICD-10-CM | POA: Diagnosis not present

## 2017-02-04 DIAGNOSIS — O09523 Supervision of elderly multigravida, third trimester: Secondary | ICD-10-CM | POA: Diagnosis not present

## 2017-02-04 HISTORY — DX: Benign neoplasm of connective and other soft tissue, unspecified: D21.9

## 2017-02-04 LAB — TROPONIN I

## 2017-02-04 LAB — CBC
HCT: 38 % (ref 36.0–46.0)
Hemoglobin: 12.9 g/dL (ref 12.0–15.0)
MCH: 30.4 pg (ref 26.0–34.0)
MCHC: 33.9 g/dL (ref 30.0–36.0)
MCV: 89.4 fL (ref 78.0–100.0)
PLATELETS: 148 10*3/uL — AB (ref 150–400)
RBC: 4.25 MIL/uL (ref 3.87–5.11)
RDW: 14.4 % (ref 11.5–15.5)
WBC: 6.3 10*3/uL (ref 4.0–10.5)

## 2017-02-04 LAB — COMPREHENSIVE METABOLIC PANEL
ALT: 19 U/L (ref 14–54)
ANION GAP: 7 (ref 5–15)
AST: 22 U/L (ref 15–41)
Albumin: 2.9 g/dL — ABNORMAL LOW (ref 3.5–5.0)
Alkaline Phosphatase: 58 U/L (ref 38–126)
BUN: 7 mg/dL (ref 6–20)
CHLORIDE: 106 mmol/L (ref 101–111)
CO2: 22 mmol/L (ref 22–32)
Calcium: 7.9 mg/dL — ABNORMAL LOW (ref 8.9–10.3)
Creatinine, Ser: 0.62 mg/dL (ref 0.44–1.00)
GFR calc non Af Amer: >60 mL/min (ref >60)
Glucose, Bld: 119 mg/dL — ABNORMAL HIGH (ref 65–99)
POTASSIUM: 3.5 mmol/L (ref 3.5–5.1)
SODIUM: 135 mmol/L (ref 135–145)
TOTAL PROTEIN: 6.4 g/dL — AB (ref 6.5–8.1)
Total Bilirubin: 0.4 mg/dL (ref 0.3–1.2)

## 2017-02-04 MED ORDER — IOPAMIDOL (ISOVUE-370) INJECTION 76%
100.0000 mL | Freq: Once | INTRAVENOUS | Status: AC | PRN
  Administered 2017-02-04: 100 mL via INTRAVENOUS

## 2017-02-04 NOTE — MAU Provider Note (Signed)
Chief Complaint:  Shortness of Breath and Chest Pain   None     HPI: Aimee Kerr is a 41 y.o. G2P0010 at [redacted]w[redacted]d who presents to maternity admissions reporting shortness of breath and substernal pressure x 1 month. She reports the pressure and shortness of breath are unchanged since onset. She was seen at [facility] 2 weeks ago and had normal cardiology work-up.  Today, she was seen by NP at [facility] who ordered a D-dimer, which was elevated.  The NP called Dr Julien Girt, who suggested the pt come to MAU for further evaluation.  Pt D-Dimer is 2,510 today per Care Everywhere. Nothing makes her pain/shortness of breath better or worse.  The pain does not radiate. It is associated with intermittent h/a. There are no other associated symptoms.  Her pregnancy has been uncomplicated with diagnoses of AMA, previous miscarriage x 1, and uterine fibroids.  She reports good fetal movement, denies regular contractions, LOF, vaginal bleeding, vaginal itching/burning, urinary symptoms, dizziness, n/v, or fever/chills.    HPI  Past Medical History: Past Medical History:  Diagnosis Date  . Fibroid   . Fibroids   . Migraines     Past obstetric history: OB History  Gravida Para Term Preterm AB Living  2       1 0  SAB TAB Ectopic Multiple Live Births  1            # Outcome Date GA Lbr Len/2nd Weight Sex Delivery Anes PTL Lv  2 Current           1 SAB 2017 [redacted]w[redacted]d             Past Surgical History: Past Surgical History:  Procedure Laterality Date  . WISDOM TOOTH EXTRACTION      Family History: Family History  Problem Relation Age of Onset  . Heart disease Mother     Social History: Social History  Substance Use Topics  . Smoking status: Former Research scientist (life sciences)  . Smokeless tobacco: Never Used  . Alcohol use No    Allergies:  Allergies  Allergen Reactions  . Other Hives and Rash    Some Seafoods--Severe rash and breakout.  Diona Fanti [Aspirin] Hives  . Formaldehyde Hives and Rash  . Sulfa  Antibiotics Hives and Rash    Meds:  Prescriptions Prior to Admission  Medication Sig Dispense Refill Last Dose  . Calcium Citrate-Vitamin D (CALCIUM + D PO) Take 1 tablet by mouth daily.   02/03/2017 at Unknown time  . Omega-3 Fatty Acids (FISH OIL) 1200 MG CAPS Take 1 capsule by mouth daily.   02/03/2017 at Unknown time  . Prenatal Vit-Fe Fumarate-FA (MULTIVITAMIN-PRENATAL) 27-0.8 MG TABS tablet Take 1 tablet by mouth daily at 12 noon.   02/03/2017 at Unknown time    ROS:  Review of Systems  Constitutional: Negative for chills, fatigue and fever.  Eyes: Negative for visual disturbance.  Respiratory: Positive for chest tightness and shortness of breath.   Cardiovascular: Positive for chest pain.  Gastrointestinal: Negative for abdominal pain, nausea and vomiting.  Genitourinary: Negative for difficulty urinating, dysuria, flank pain, pelvic pain, vaginal bleeding, vaginal discharge and vaginal pain.  Neurological: Positive for headaches. Negative for dizziness.  Psychiatric/Behavioral: Negative.      I have reviewed patient's Past Medical Hx, Surgical Hx, Family Hx, Social Hx, medications and allergies.   Physical Exam   Patient Vitals for the past 24 hrs:  BP Temp Temp src Pulse Resp SpO2  02/04/17 1900 - - - - -  97 %  02/04/17 1627 117/68 97.7 F (36.5 C) Oral (!) 104 18 100 %   Constitutional: Well-developed, well-nourished female in no acute distress.  HEART: normal rate, heart sounds, regular rhythm RESP: normal effort, lung sounds clear and equal bilaterally GI: Abd soft, non-tender, gravid appropriate for gestational age.  MS: Extremities nontender, no edema, normal ROM Neurologic: Alert and oriented x 4.  GU: Neg CVAT.  PELVIC EXAM: Cervix pink, visually closed, without lesion, scant white creamy discharge, vaginal walls and external genitalia normal Bimanual exam: Cervix 0/long/high, firm, anterior, neg CMT, uterus nontender, nonenlarged, adnexa without tenderness,  enlargement, or mass     FHT:  Baseline 140 , moderate variability, accelerations present, no decelerations Contractions: irregular, q 2-15 minutes, mild to palpation   Labs: Results for orders placed or performed during the hospital encounter of 02/04/17 (from the past 24 hour(s))  CBC     Status: Abnormal   Collection Time: 02/04/17  5:29 PM  Result Value Ref Range   WBC 6.3 4.0 - 10.5 K/uL   RBC 4.25 3.87 - 5.11 MIL/uL   Hemoglobin 12.9 12.0 - 15.0 g/dL   HCT 38.0 36.0 - 46.0 %   MCV 89.4 78.0 - 100.0 fL   MCH 30.4 26.0 - 34.0 pg   MCHC 33.9 30.0 - 36.0 g/dL   RDW 14.4 11.5 - 15.5 %   Platelets 148 (L) 150 - 400 K/uL  Comprehensive metabolic panel     Status: Abnormal   Collection Time: 02/04/17  5:29 PM  Result Value Ref Range   Sodium 135 135 - 145 mmol/L   Potassium 3.5 3.5 - 5.1 mmol/L   Chloride 106 101 - 111 mmol/L   CO2 22 22 - 32 mmol/L   Glucose, Bld 119 (H) 65 - 99 mg/dL   BUN 7 6 - 20 mg/dL   Creatinine, Ser 0.62 0.44 - 1.00 mg/dL   Calcium 7.9 (L) 8.9 - 10.3 mg/dL   Total Protein 6.4 (L) 6.5 - 8.1 g/dL   Albumin 2.9 (L) 3.5 - 5.0 g/dL   AST 22 15 - 41 U/L   ALT 19 14 - 54 U/L   Alkaline Phosphatase 58 38 - 126 U/L   Total Bilirubin 0.4 0.3 - 1.2 mg/dL   GFR calc non Af Amer >60 >60 mL/min   GFR calc Af Amer >60 >60 mL/min   Anion gap 7 5 - 15  Troponin I     Status: None   Collection Time: 02/04/17  5:29 PM  Result Value Ref Range   Troponin I <0.03 <0.03 ng/mL      Imaging:  Ct Angio Chest Pe W Or Wo Contrast  Result Date: 02/04/2017 CLINICAL DATA:  Chest pressure and dyspnea since October. EXAM: CT ANGIOGRAPHY CHEST WITH CONTRAST TECHNIQUE: Multidetector CT imaging of the chest was performed using the standard protocol during bolus administration of intravenous contrast. Multiplanar CT image reconstructions and MIPs were obtained to evaluate the vascular anatomy. CONTRAST:  100 cc Isovue 370 IV COMPARISON:  Pregnancy ultrasound from 10/07/2016. It  is noted that the patient has large uterine fibroids off the fundus measuring 15.1 x 9.9 x 15.7 cm and 14.5 x 12.2 x 12.9 cm. FINDINGS: Cardiovascular: Satisfactory opacification of the pulmonary arteries to the segmental level. No evidence of pulmonary embolism. Normal heart size. No pericardial effusion. No aortic aneurysm or dissection. Mediastinum/Nodes: No enlarged mediastinal, hilar, or axillary lymph nodes. Thyroid gland, trachea, and esophagus demonstrate no significant findings. Lungs/Pleura: 3.3 mm  in average left posterior subpleural pulmonary nodule in the upper lobe, image 25/7. No pneumonic consolidation nor effusion. Respiratory motion artifacts limit assessment at the lung bases. Upper Abdomen: There is a partially imaged soft tissue mass projecting along the upper ventral abdomen that may reflect one of the patient's known uterine fibroids as seen on comparison pregnancy ultrasound from 10/07/2016. This is incompletely imaged but measures 14.4 x 9.7 cm in transverse by AP dimension. Its caudal extent is not included on this study. Reassessment could be performed with ultrasound to exclude other etiologies. Musculoskeletal: No chest wall abnormality. No acute or significant osseous findings. Review of the MIP images confirms the above findings. IMPRESSION: 1. No acute pulmonary embolus.  No aortic aneurysm or dissection. 2. 3.3 mm in average left upper lobe pulmonary nodule. No follow-up needed if patient is low-risk. Non-contrast chest CT can be considered in 12 months if patient is high-risk. This recommendation follows the consensus statement: Guidelines for Management of Incidental Pulmonary Nodules Detected on CT Images: From the Fleischner Society 2017; Radiology 2017; 284:228-243. 3. Partially imaged upper abdominal mass likely to reflect one of the patient's known uterine fibroids. This is incompletely imaged however is noted to measure at least 14.4 cm transverse by 9.7 cm AP. It's  craniocaudad extent is incompletely assessed. Further assessment could be performed with repeat pelvic sonography. Electronically Signed   By: Ashley Royalty M.D.   On: 02/04/2017 20:15    MAU Course/MDM: I have ordered labs and reviewed results.  NST reviewed and reactive Although D-dimer is likely elevated due to pregnancy, value is significant at 2500, and pt presented multiple times with shortness of breath and chest pressure so this warrants further investigation Consult Dr Julien Girt with presentation, exam findings and test results.  Ordered spiral CT to rule out PE Pt has questions about safety of CT in pregnancy, treatments for PE and their safety in pregnancy Answered pt questions, presented CT as low-risk, and risks of PE high so benefits of scan outweigh risks Pt continues to be concerned about imaging safety so consult Dr Julien Girt who came to bedside to talk with pt Pt agreed to CT scan Results were negative for PE, incidental nodule found and uterine fibroids seen in upper abdomen Consult Julien Girt. Likely cause of pt symptoms is enlarged uterus from fibroids Discussed results with pt who states understanding May try sitting up to sleep Pt to f/u in office as scheduled, return to MAU as needed for emergencies Pt discharge with strict respiratory/preterm labor precautions.  Assessment: 1. Uterine fibroids affecting pregnancy, antepartum, third trimester   2. Chest pain in adult   3. Shortness of breath due to pregnancy in third trimester     Plan: Discharge home Labor precautions and fetal kick counts Follow-up Information    Marylynn Pearson, MD Follow up.   Specialty:  Obstetrics and Gynecology Why:  As scheduled, return to MAU as needed for emergencies  Contact information: [place], [place] [address] [phone]          Allergies as of 02/04/2017      Reactions   Other Hives, Rash   Some Seafoods--Severe rash and breakout.   Asa [aspirin]  Hives   Formaldehyde Hives, Rash   Sulfa Antibiotics Hives, Rash      Medication List    TAKE these medications   CALCIUM + D PO Take 1 tablet by mouth daily.   Fish Oil 1200 MG Caps Take 1 capsule by mouth  daily.   multivitamin-prenatal 27-0.8 MG Tabs tablet Take 1 tablet by mouth daily at 12 noon.       Fatima Blank Certified Nurse-Midwife 02/04/2017 8:55 PM

## 2017-02-04 NOTE — MAU Note (Signed)
Pt states she has had chest pressure & SOB since the beginning of October , seen @ [facility] around 2 weeks ago, [place].  Saw NP @ [facility] today, D-dimer was elevated.  NP spoke with Dr. Julien Girt, pt advised to come to MAU.  Continues to have chest pressure now, denies contractions, bleeding or LOF.

## 2017-03-30 ENCOUNTER — Encounter (HOSPITAL_COMMUNITY): Payer: Self-pay | Admitting: *Deleted

## 2017-03-30 ENCOUNTER — Telehealth (HOSPITAL_COMMUNITY): Payer: Self-pay | Admitting: *Deleted

## 2017-03-30 LAB — OB RESULTS CONSOLE GBS: GBS: POSITIVE

## 2017-03-30 NOTE — Telephone Encounter (Signed)
Preadmission screen  

## 2017-04-07 NOTE — H&P (Signed)
Aimee Kerr is a 42 y.o. female presenting for postdates IOL @ 41 wks.  Pregnancy uncomplicated.  Pt has known fibroids - larges 14cm (fundal) and 11cm (left).  OB History    Gravida Para Term Preterm AB Living   2       1 0   SAB TAB Ectopic Multiple Live Births   1             Past Medical History:  Diagnosis Date  . AMA (advanced maternal age) primigravida 83+, third trimester   . Fibroid   . Fibroids   . HSV (herpes simplex virus) anogenital infection   . Migraines    Past Surgical History:  Procedure Laterality Date  . WISDOM TOOTH EXTRACTION     Family History: family history includes Breast cancer in her maternal aunt; Colon cancer in her maternal uncle; Diabetes in her maternal grandmother; Heart disease in her mother; Heart failure in her maternal aunt and mother; Hypertension in her maternal aunt; Lupus in her sister; Rheum arthritis in her maternal aunt; Seizures in her maternal aunt. Social History:  reports that she has quit smoking. she has never used smokeless tobacco. She reports that she does not drink alcohol or use drugs.     Maternal Diabetes: No Genetic Screening: Normal Maternal Ultrasounds/Referrals: Normal Fetal Ultrasounds or other Referrals:  None Maternal Substance Abuse:  No Significant Maternal Medications:  None Significant Maternal Lab Results:  None Other Comments:  None  ROS History   Last menstrual period 06/23/2016. Exam Physical Exam  Gen - NAD ABd - gravdid, NT Ext - NT Cvx closed Prenatal labs: ABO, Rh: A/Positive/-- (08/02 0000) Antibody: Negative (08/02 0000) Rubella: Immune (08/02 0000) RPR: Nonreactive (08/02 0000)  HBsAg: Negative (08/02 0000)  HIV: Non-reactive (08/02 0000)  GBS: Positive (12/26 0000)   Assessment/Plan: Admit cytotec PCN H/o HSV - valtrex   Aimee Kerr 04/07/2017, 5:44 PM

## 2017-04-08 ENCOUNTER — Inpatient Hospital Stay (HOSPITAL_COMMUNITY)
Admission: RE | Admit: 2017-04-08 | Discharge: 2017-04-12 | DRG: 787 | Disposition: A | Discharge status: Home / Self Care | Payer: BLUE CROSS/BLUE SHIELD | Source: Ambulatory Visit | Attending: Obstetrics and Gynecology | Admitting: Obstetrics and Gynecology

## 2017-04-08 ENCOUNTER — Encounter (HOSPITAL_COMMUNITY): Payer: Self-pay

## 2017-04-08 ENCOUNTER — Encounter (HOSPITAL_COMMUNITY)
Admission: RE | Disposition: A | Discharge status: Home / Self Care | Payer: Self-pay | Source: Ambulatory Visit | Attending: Obstetrics and Gynecology

## 2017-04-08 ENCOUNTER — Inpatient Hospital Stay (HOSPITAL_COMMUNITY): Payer: BLUE CROSS/BLUE SHIELD | Admitting: Anesthesiology

## 2017-04-08 DIAGNOSIS — O480 Post-term pregnancy: Secondary | ICD-10-CM | POA: Diagnosis present

## 2017-04-08 DIAGNOSIS — A6000 Herpesviral infection of urogenital system, unspecified: Secondary | ICD-10-CM | POA: Diagnosis present

## 2017-04-08 DIAGNOSIS — O3413 Maternal care for benign tumor of corpus uteri, third trimester: Secondary | ICD-10-CM | POA: Diagnosis present

## 2017-04-08 DIAGNOSIS — O9832 Other infections with a predominantly sexual mode of transmission complicating childbirth: Secondary | ICD-10-CM | POA: Diagnosis present

## 2017-04-08 DIAGNOSIS — D259 Leiomyoma of uterus, unspecified: Secondary | ICD-10-CM | POA: Diagnosis present

## 2017-04-08 DIAGNOSIS — Z3A41 41 weeks gestation of pregnancy: Secondary | ICD-10-CM | POA: Diagnosis not present

## 2017-04-08 DIAGNOSIS — Z87891 Personal history of nicotine dependence: Secondary | ICD-10-CM

## 2017-04-08 DIAGNOSIS — O99824 Streptococcus B carrier state complicating childbirth: Secondary | ICD-10-CM | POA: Diagnosis present

## 2017-04-08 DIAGNOSIS — D219 Benign neoplasm of connective and other soft tissue, unspecified: Secondary | ICD-10-CM | POA: Diagnosis present

## 2017-04-08 DIAGNOSIS — Z98891 History of uterine scar from previous surgery: Secondary | ICD-10-CM

## 2017-04-08 LAB — CBC
HCT: 39.5 % (ref 36.0–46.0)
Hemoglobin: 13.6 g/dL (ref 12.0–15.0)
MCH: 30.9 pg (ref 26.0–34.0)
MCHC: 34.4 g/dL (ref 30.0–36.0)
MCV: 89.8 fL (ref 78.0–100.0)
Platelets: 165 10*3/uL (ref 150–400)
RBC: 4.4 MIL/uL (ref 3.87–5.11)
RDW: 14.7 % (ref 11.5–15.5)
WBC: 6.9 10*3/uL (ref 4.0–10.5)

## 2017-04-08 LAB — SYPHILIS: RPR W/REFLEX TO RPR TITER AND TREPONEMAL ANTIBODIES, TRADITIONAL SCREENING AND DIAGNOSIS ALGORITHM: RPR Ser Ql: NONREACTIVE

## 2017-04-08 LAB — ABO/RH: ABO/RH(D): A POS

## 2017-04-08 SURGERY — Surgical Case
Anesthesia: Epidural | Wound class: Clean Contaminated

## 2017-04-08 MED ORDER — PHENYLEPHRINE 40 MCG/ML (10ML) SYRINGE FOR IV PUSH (FOR BLOOD PRESSURE SUPPORT): 80.0000 ug | PREFILLED_SYRINGE | INTRAVENOUS | Status: DC | PRN

## 2017-04-08 MED ORDER — LACTATED RINGERS IV SOLN
500.0000 mL | INTRAVENOUS | Status: DC | PRN
  Administered 2017-04-08 (×2): 500 mL via INTRAVENOUS

## 2017-04-08 MED ORDER — FENTANYL 2.5 MCG/ML BUPIVACAINE 1/10 % EPIDURAL INFUSION (WH - ANES): 14.0000 mL/h | INTRAMUSCULAR | Status: DC | PRN

## 2017-04-08 MED ORDER — EPHEDRINE 5 MG/ML INJ: 10.0000 mg | INTRAVENOUS | Status: DC | PRN

## 2017-04-08 MED ORDER — ACETAMINOPHEN 325 MG PO TABS: 650.0000 mg | ORAL_TABLET | ORAL | Status: DC | PRN

## 2017-04-08 MED ORDER — LACTATED RINGERS IV SOLN: 500.0000 mL | Freq: Once | INTRAVENOUS | Status: DC

## 2017-04-08 MED ORDER — ONDANSETRON HCL 4 MG/2ML IJ SOLN: 4.0000 mg | Freq: Four times a day (QID) | INTRAMUSCULAR | Status: DC | PRN

## 2017-04-08 MED ORDER — SOD CITRATE-CITRIC ACID 500-334 MG/5ML PO SOLN
30.0000 mL | ORAL | Status: DC | PRN
  Administered 2017-04-09: 30 mL via ORAL
  Filled 2017-04-08: qty 15

## 2017-04-08 MED ORDER — DIPHENHYDRAMINE HCL 50 MG/ML IJ SOLN: 12.5000 mg | INTRAMUSCULAR | Status: DC | PRN

## 2017-04-08 MED ORDER — DEXAMETHASONE SODIUM PHOSPHATE 4 MG/ML IJ SOLN
INTRAMUSCULAR | Status: AC
  Filled 2017-04-08: qty 1

## 2017-04-08 MED ORDER — PHENYLEPHRINE 40 MCG/ML (10ML) SYRINGE FOR IV PUSH (FOR BLOOD PRESSURE SUPPORT)
80.0000 ug | PREFILLED_SYRINGE | INTRAVENOUS | Status: DC | PRN
  Administered 2017-04-08: 80 ug via INTRAVENOUS

## 2017-04-08 MED ORDER — LACTATED RINGERS IV SOLN
INTRAVENOUS | Status: DC
  Administered 2017-04-08 (×4): via INTRAVENOUS

## 2017-04-08 MED ORDER — FENTANYL 2.5 MCG/ML BUPIVACAINE 1/10 % EPIDURAL INFUSION (WH - ANES)
14.0000 mL/h | INTRAMUSCULAR | Status: DC | PRN
  Administered 2017-04-08 (×2): 14 mL/h via EPIDURAL
  Filled 2017-04-08 (×2): qty 100

## 2017-04-08 MED ORDER — SODIUM BICARBONATE 8.4 % IV SOLN
INTRAVENOUS | Status: AC
  Filled 2017-04-08: qty 50

## 2017-04-08 MED ORDER — TERBUTALINE SULFATE 1 MG/ML IJ SOLN: 0.2500 mg | Freq: Once | INTRAMUSCULAR | Status: DC | PRN

## 2017-04-08 MED ORDER — LIDOCAINE-EPINEPHRINE (PF) 2 %-1:200000 IJ SOLN
INTRAMUSCULAR | Status: AC
  Filled 2017-04-08: qty 20

## 2017-04-08 MED ORDER — OXYTOCIN BOLUS FROM INFUSION: 500.0000 mL | Freq: Once | INTRAVENOUS | Status: DC

## 2017-04-08 MED ORDER — OXYTOCIN 40 UNITS IN LACTATED RINGERS INFUSION - SIMPLE MED
2.5000 [IU]/h | INTRAVENOUS | Status: DC
  Filled 2017-04-08: qty 1000

## 2017-04-08 MED ORDER — PENICILLIN G POTASSIUM 5000000 UNITS IJ SOLR
5.0000 10*6.[IU] | Freq: Once | INTRAVENOUS | Status: AC
  Administered 2017-04-08: 5 10*6.[IU] via INTRAVENOUS
  Filled 2017-04-08: qty 5

## 2017-04-08 MED ORDER — OXYTOCIN 10 UNIT/ML IJ SOLN
INTRAMUSCULAR | Status: AC
  Filled 2017-04-08: qty 4

## 2017-04-08 MED ORDER — LIDOCAINE HCL (PF) 1 % IJ SOLN: 30.0000 mL | INTRAMUSCULAR | Status: DC | PRN

## 2017-04-08 MED ORDER — LACTATED RINGERS IV SOLN
500.0000 mL | Freq: Once | INTRAVENOUS | Status: AC
  Administered 2017-04-08: 500 mL via INTRAVENOUS

## 2017-04-08 MED ORDER — OXYTOCIN 40 UNITS IN LACTATED RINGERS INFUSION - SIMPLE MED
1.0000 m[IU]/min | INTRAVENOUS | Status: DC
  Administered 2017-04-08: 6 m[IU]/min via INTRAVENOUS
  Administered 2017-04-08: 8 m[IU]/min via INTRAVENOUS
  Administered 2017-04-08: 4 m[IU]/min via INTRAVENOUS
  Administered 2017-04-08: 2 m[IU]/min via INTRAVENOUS

## 2017-04-08 MED ORDER — SCOPOLAMINE 1 MG/3DAYS TD PT72
MEDICATED_PATCH | TRANSDERMAL | Status: AC
  Filled 2017-04-08: qty 1

## 2017-04-08 MED ORDER — PHENYLEPHRINE 40 MCG/ML (10ML) SYRINGE FOR IV PUSH (FOR BLOOD PRESSURE SUPPORT)
80.0000 ug | PREFILLED_SYRINGE | INTRAVENOUS | Status: DC | PRN
  Filled 2017-04-08: qty 10

## 2017-04-08 MED ORDER — MORPHINE SULFATE (PF) 0.5 MG/ML IJ SOLN
INTRAMUSCULAR | Status: AC
  Filled 2017-04-08: qty 10

## 2017-04-08 MED ORDER — PENICILLIN G POT IN DEXTROSE 60000 UNIT/ML IV SOLN
3.0000 10*6.[IU] | INTRAVENOUS | Status: DC
  Administered 2017-04-08 (×5): 3 10*6.[IU] via INTRAVENOUS
  Filled 2017-04-08 (×9): qty 50

## 2017-04-08 MED ORDER — ONDANSETRON HCL 4 MG/2ML IJ SOLN
INTRAMUSCULAR | Status: AC
  Filled 2017-04-08: qty 4

## 2017-04-08 MED ORDER — MISOPROSTOL 25 MCG QUARTER TABLET
25.0000 ug | ORAL_TABLET | ORAL | Status: DC | PRN
  Administered 2017-04-08 (×2): 25 ug via VAGINAL
  Filled 2017-04-08 (×2): qty 1

## 2017-04-08 MED ORDER — LIDOCAINE HCL (PF) 1 % IJ SOLN
INTRAMUSCULAR | Status: DC | PRN
  Administered 2017-04-08 (×2): 5 mL via EPIDURAL

## 2017-04-08 SURGICAL SUPPLY — 32 items
ADH SKN CLS APL DERMABOND .7 (GAUZE/BANDAGES/DRESSINGS) ×1
CHLORAPREP W/TINT 26ML (MISCELLANEOUS) ×3 IMPLANT
CLAMP CORD UMBIL (MISCELLANEOUS) IMPLANT
CLOTH BEACON ORANGE TIMEOUT ST (SAFETY) ×3 IMPLANT
DERMABOND ADVANCED (GAUZE/BANDAGES/DRESSINGS) ×2
DERMABOND ADVANCED .7 DNX12 (GAUZE/BANDAGES/DRESSINGS) ×1 IMPLANT
DRSG OPSITE POSTOP 4X10 (GAUZE/BANDAGES/DRESSINGS) ×3 IMPLANT
ELECT REM PT RETURN 9FT ADLT (ELECTROSURGICAL) ×3
ELECTRODE REM PT RTRN 9FT ADLT (ELECTROSURGICAL) ×1 IMPLANT
EXTRACTOR VACUUM M CUP 4 TUBE (SUCTIONS) IMPLANT
EXTRACTOR VACUUM M CUP 4' TUBE (SUCTIONS)
GLOVE BIO SURGEON STRL SZ 6.5 (GLOVE) ×2 IMPLANT
GLOVE BIO SURGEONS STRL SZ 6.5 (GLOVE) ×1
GLOVE BIOGEL PI IND STRL 7.0 (GLOVE) ×2 IMPLANT
GLOVE BIOGEL PI INDICATOR 7.0 (GLOVE) ×4
GOWN STRL REUS W/TWL LRG LVL3 (GOWN DISPOSABLE) ×6 IMPLANT
KIT ABG SYR 3ML LUER SLIP (SYRINGE) IMPLANT
NDL HYPO 25X5/8 SAFETYGLIDE (NEEDLE) IMPLANT
NEEDLE HYPO 25X5/8 SAFETYGLIDE (NEEDLE) IMPLANT
NS IRRIG 1000ML POUR BTL (IV SOLUTION) ×3 IMPLANT
PACK C SECTION WH (CUSTOM PROCEDURE TRAY) ×3 IMPLANT
PAD OB MATERNITY 4.3X12.25 (PERSONAL CARE ITEMS) ×3 IMPLANT
PENCIL SMOKE EVAC W/HOLSTER (ELECTROSURGICAL) ×3 IMPLANT
SUT CHROMIC 0 CT 802H (SUTURE) IMPLANT
SUT CHROMIC 0 CTX 36 (SUTURE) ×9 IMPLANT
SUT MON AB-0 CT1 36 (SUTURE) ×3 IMPLANT
SUT PDS AB 0 CTX 60 (SUTURE) ×3 IMPLANT
SUT PLAIN 0 NONE (SUTURE) IMPLANT
SUT VIC AB 4-0 KS 27 (SUTURE) IMPLANT
SYR BULB 3OZ (MISCELLANEOUS) ×3 IMPLANT
TOWEL OR 17X24 6PK STRL BLUE (TOWEL DISPOSABLE) ×3 IMPLANT
TRAY FOLEY BAG SILVER LF 14FR (SET/KITS/TRAYS/PACK) IMPLANT

## 2017-04-08 NOTE — Progress Notes (Signed)
Pt comfortable   FHT 140s w/ scalp stim Toco Q3 Cvx 4/90/-2, unchanged  A/P:  Continue pitocin

## 2017-04-08 NOTE — Anesthesia Preprocedure Evaluation (Addendum)

## 2017-04-08 NOTE — Anesthesia Procedure Notes (Signed)
Epidural Patient location during procedure: OB Start time: 04/08/2017 11:51 AM End time: 04/08/2017 12:00 PM  Staffing Anesthesiologist: Janeece Riggers, MD  Preanesthetic Checklist Completed: patient identified, site marked, surgical consent, pre-op evaluation, timeout performed, IV checked, risks and benefits discussed and monitors and equipment checked  Epidural Patient position: sitting Prep: site prepped and draped and DuraPrep Patient monitoring: continuous pulse ox and blood pressure Approach: midline Location: L3-L4 Injection technique: LOR air  Needle:  Needle type: Tuohy  Needle gauge: 17 G Needle length: 9 cm and 9 Needle insertion depth: 7 cm Catheter type: closed end flexible Catheter size: 19 Gauge Catheter at skin depth: 12 cm Test dose: negative  Assessment Events: blood not aspirated, injection not painful, no injection resistance, negative IV test and no paresthesia

## 2017-04-08 NOTE — Progress Notes (Signed)
Pt comfortable w/ epidural  FHT decrease in variability but good scalp stim w/ exm Toco Q@-3 Cvx 4/90/-2 IUPC placed  A/P:  Continue exp mngt

## 2017-04-08 NOTE — Progress Notes (Signed)
Pt comfortable w/ epidural  FHT cat 2, good scalp stim Toco Q3 Cvx 2/90/-2  A/P:  Continue indxn Position changes

## 2017-04-08 NOTE — Progress Notes (Signed)
Pt feeling occ ctx  FHT cat 1 Toco occasional Cvx 1/50/-2 AROM - clear  A/P:  Pitocin Epidural prn

## 2017-04-08 NOTE — Anesthesia Pain Management Evaluation Note (Signed)
  CRNA Pain Management Visit Note  Patient: Aimee Kerr, 42 y.o., female  "Hello I am a member of the anesthesia team at [facility]. We have an anesthesia team available at all times to provide care throughout the hospital, including epidural management and anesthesia for C-section. I don't know your plan for the delivery whether it a natural birth, water birth, IV sedation, nitrous supplementation, doula or epidural, but we want to meet your pain goals."   1.Was your pain managed to your expectations on prior hospitalizations?   No prior hospitalizations  2.What is your expectation for pain management during this hospitalization?     Epidural  3.How can we help you reach that goal? Epidural when desired  Record the patient's initial score and the patient's pain goal.   Pain: 0  Pain Goal: 5 The [facility] wants you to be able to say your pain was always managed very well.  [name] 04/08/2017

## 2017-04-09 ENCOUNTER — Encounter (HOSPITAL_COMMUNITY): Payer: Self-pay | Admitting: Obstetrics and Gynecology

## 2017-04-09 ENCOUNTER — Inpatient Hospital Stay (HOSPITAL_COMMUNITY): Payer: BLUE CROSS/BLUE SHIELD

## 2017-04-09 DIAGNOSIS — Z98891 History of uterine scar from previous surgery: Secondary | ICD-10-CM

## 2017-04-09 LAB — CBC
HEMATOCRIT: 35.9 % — AB (ref 36.0–46.0)
HEMATOCRIT: 36.6 % (ref 36.0–46.0)
HEMATOCRIT: 37.7 % (ref 36.0–46.0)
Hemoglobin: 12.2 g/dL (ref 12.0–15.0)
Hemoglobin: 12.4 g/dL (ref 12.0–15.0)
Hemoglobin: 12.8 g/dL (ref 12.0–15.0)
MCH: 30.7 pg (ref 26.0–34.0)
MCH: 30.8 pg (ref 26.0–34.0)
MCH: 30.7 pg (ref 26.0–34.0)
MCHC: 34 g/dL (ref 30.0–36.0)
MCHC: 33.9 g/dL (ref 30.0–36.0)
MCHC: 34 g/dL (ref 30.0–36.0)
MCV: 90.2 fL (ref 78.0–100.0)
MCV: 91 fL (ref 78.0–100.0)
MCV: 90.4 fL (ref 78.0–100.0)
PLATELETS: 135 10*3/uL — AB (ref 150–400)
Platelets: 129 10*3/uL — ABNORMAL LOW (ref 150–400)
Platelets: 125 10*3/uL — ABNORMAL LOW (ref 150–400)
RBC: 3.98 MIL/uL (ref 3.87–5.11)
RBC: 4.02 MIL/uL (ref 3.87–5.11)
RBC: 4.17 MIL/uL (ref 3.87–5.11)
RDW: 14.5 % (ref 11.5–15.5)
RDW: 14.7 % (ref 11.5–15.5)
RDW: 14.6 % (ref 11.5–15.5)
WBC: 13 10*3/uL — ABNORMAL HIGH (ref 4.0–10.5)
WBC: 14.3 10*3/uL — AB (ref 4.0–10.5)
WBC: 17.5 10*3/uL — ABNORMAL HIGH (ref 4.0–10.5)

## 2017-04-09 LAB — COMPREHENSIVE METABOLIC PANEL
ALBUMIN: 2.4 g/dL — AB (ref 3.5–5.0)
ALT: 24 U/L (ref 14–54)
AST: 35 U/L (ref 15–41)
Alkaline Phosphatase: 73 U/L (ref 38–126)
Anion gap: 9 (ref 5–15)
BILIRUBIN TOTAL: 1.4 mg/dL — AB (ref 0.3–1.2)
BUN: 10 mg/dL (ref 6–20)
CHLORIDE: 104 mmol/L (ref 101–111)
CO2: 21 mmol/L — ABNORMAL LOW (ref 22–32)
CREATININE: 0.75 mg/dL (ref 0.44–1.00)
Calcium: 8.4 mg/dL — ABNORMAL LOW (ref 8.9–10.3)
GFR calc Af Amer: >60 mL/min (ref >60)
GFR calc non Af Amer: >60 mL/min (ref >60)
GLUCOSE: 123 mg/dL — AB (ref 65–99)
Potassium: 4 mmol/L (ref 3.5–5.1)
Sodium: 134 mmol/L — ABNORMAL LOW (ref 135–145)
TOTAL PROTEIN: 5.3 g/dL — AB (ref 6.5–8.1)

## 2017-04-09 LAB — PREPARE RBC (CROSSMATCH)

## 2017-04-09 MED ORDER — FENTANYL CITRATE (PF) 100 MCG/2ML IJ SOLN
INTRAMUSCULAR | Status: AC
  Filled 2017-04-09: qty 2

## 2017-04-09 MED ORDER — DIPHENHYDRAMINE HCL 25 MG PO CAPS: 25.0000 mg | ORAL_CAPSULE | ORAL | Status: DC | PRN

## 2017-04-09 MED ORDER — NALBUPHINE HCL 10 MG/ML IJ SOLN: 5.0000 mg | Freq: Once | INTRAMUSCULAR | Status: DC | PRN

## 2017-04-09 MED ORDER — MEASLES, MUMPS & RUBELLA VAC ~~LOC~~ INJ
0.5000 mL | INJECTION | Freq: Once | SUBCUTANEOUS | Status: DC
  Filled 2017-04-09: qty 0.5

## 2017-04-09 MED ORDER — ONDANSETRON HCL 4 MG/2ML IJ SOLN
INTRAMUSCULAR | Status: DC | PRN
  Administered 2017-04-09: 4 mg via INTRAVENOUS

## 2017-04-09 MED ORDER — ONDANSETRON HCL 4 MG/2ML IJ SOLN: 4.0000 mg | Freq: Three times a day (TID) | INTRAMUSCULAR | Status: DC | PRN

## 2017-04-09 MED ORDER — MORPHINE SULFATE (PF) 0.5 MG/ML IJ SOLN
INTRAMUSCULAR | Status: DC | PRN
  Administered 2017-04-09: 1 mg via INTRAVENOUS
  Administered 2017-04-09: 4 mg via EPIDURAL

## 2017-04-09 MED ORDER — NALBUPHINE HCL 10 MG/ML IJ SOLN: 5.0000 mg | INTRAMUSCULAR | Status: DC | PRN

## 2017-04-09 MED ORDER — SODIUM CHLORIDE 0.9 % IV SOLN: 10.0000 mL/h | Freq: Once | INTRAVENOUS | Status: DC

## 2017-04-09 MED ORDER — SODIUM CHLORIDE 0.9 % IR SOLN
Status: DC | PRN
  Administered 2017-04-09: 1

## 2017-04-09 MED ORDER — ACETAMINOPHEN 500 MG PO TABS
1000.0000 mg | ORAL_TABLET | Freq: Four times a day (QID) | ORAL | Status: AC
  Administered 2017-04-09 (×3): 1000 mg via ORAL
  Filled 2017-04-09 (×3): qty 2

## 2017-04-09 MED ORDER — ACETAMINOPHEN 325 MG PO TABS: 650.0000 mg | ORAL_TABLET | ORAL | Status: DC | PRN

## 2017-04-09 MED ORDER — FENTANYL CITRATE (PF) 100 MCG/2ML IJ SOLN
INTRAMUSCULAR | Status: DC | PRN
  Administered 2017-04-09: 50 ug via INTRAVENOUS
  Administered 2017-04-09: 100 ug via EPIDURAL
  Administered 2017-04-09: 50 ug via INTRAVENOUS

## 2017-04-09 MED ORDER — SENNOSIDES-DOCUSATE SODIUM 8.6-50 MG PO TABS
2.0000 | ORAL_TABLET | ORAL | Status: DC
  Administered 2017-04-09 – 2017-04-12 (×3): 2 via ORAL
  Filled 2017-04-09 (×3): qty 2

## 2017-04-09 MED ORDER — SCOPOLAMINE 1 MG/3DAYS TD PT72
MEDICATED_PATCH | TRANSDERMAL | Status: AC
  Filled 2017-04-09: qty 1

## 2017-04-09 MED ORDER — IOPAMIDOL (ISOVUE-300) INJECTION 61%
100.0000 mL | Freq: Once | INTRAVENOUS | Status: AC | PRN
  Administered 2017-04-09: 100 mL via INTRAVENOUS

## 2017-04-09 MED ORDER — DIBUCAINE 1 % RE OINT: 1.0000 "application " | TOPICAL_OINTMENT | RECTAL | Status: DC | PRN

## 2017-04-09 MED ORDER — IBUPROFEN 600 MG PO TABS: 600.0000 mg | ORAL_TABLET | Freq: Four times a day (QID) | ORAL | Status: DC

## 2017-04-09 MED ORDER — ACETAMINOPHEN 10 MG/ML IV SOLN
1000.0000 mg | Freq: Once | INTRAVENOUS | Status: AC
  Administered 2017-04-09: 1000 mg via INTRAVENOUS
  Filled 2017-04-09: qty 100

## 2017-04-09 MED ORDER — SOD CITRATE-CITRIC ACID 500-334 MG/5ML PO SOLN: 30.0000 mL | Freq: Once | ORAL | Status: DC

## 2017-04-09 MED ORDER — NALOXONE HCL 0.4 MG/ML IJ SOLN: 0.4000 mg | INTRAMUSCULAR | Status: DC | PRN

## 2017-04-09 MED ORDER — CEFAZOLIN SODIUM-DEXTROSE 2-4 GM/100ML-% IV SOLN
2.0000 g | Freq: Once | INTRAVENOUS | Status: AC
  Administered 2017-04-09: 2 g via INTRAVENOUS

## 2017-04-09 MED ORDER — DEXAMETHASONE SODIUM PHOSPHATE 4 MG/ML IJ SOLN
INTRAMUSCULAR | Status: AC
  Filled 2017-04-09: qty 1

## 2017-04-09 MED ORDER — SODIUM BICARBONATE 8.4 % IV SOLN
INTRAVENOUS | Status: AC
  Filled 2017-04-09: qty 50

## 2017-04-09 MED ORDER — COCONUT OIL OIL: 1.0000 "application " | TOPICAL_OIL | Status: DC | PRN

## 2017-04-09 MED ORDER — MORPHINE SULFATE (PF) 0.5 MG/ML IJ SOLN
INTRAMUSCULAR | Status: AC
  Filled 2017-04-09: qty 10

## 2017-04-09 MED ORDER — DIPHENHYDRAMINE HCL 50 MG/ML IJ SOLN: 12.5000 mg | INTRAMUSCULAR | Status: DC | PRN

## 2017-04-09 MED ORDER — LACTATED RINGERS IV SOLN: 100.0000 mL/h | INTRAVENOUS | Status: DC

## 2017-04-09 MED ORDER — LIDOCAINE-EPINEPHRINE (PF) 2 %-1:200000 IJ SOLN
INTRAMUSCULAR | Status: AC
  Filled 2017-04-09: qty 20

## 2017-04-09 MED ORDER — NALOXONE HCL 0.4 MG/ML IJ SOLN
1.0000 ug/kg/h | INTRAVENOUS | Status: DC | PRN
  Filled 2017-04-09: qty 5

## 2017-04-09 MED ORDER — ACETAMINOPHEN 500 MG PO TABS: 1000.0000 mg | ORAL_TABLET | Freq: Four times a day (QID) | ORAL | Status: DC

## 2017-04-09 MED ORDER — DEXAMETHASONE SODIUM PHOSPHATE 4 MG/ML IJ SOLN
INTRAMUSCULAR | Status: DC | PRN
  Administered 2017-04-09: 4 mg via INTRAVENOUS

## 2017-04-09 MED ORDER — TETANUS-DIPHTH-ACELL PERTUSSIS 5-2.5-18.5 LF-MCG/0.5 IM SUSP: 0.5000 mL | Freq: Once | INTRAMUSCULAR | Status: DC

## 2017-04-09 MED ORDER — OXYTOCIN 10 UNIT/ML IJ SOLN
INTRAMUSCULAR | Status: AC
  Filled 2017-04-09: qty 4

## 2017-04-09 MED ORDER — SODIUM CHLORIDE 0.9% FLUSH: 3.0000 mL | INTRAVENOUS | Status: DC | PRN

## 2017-04-09 MED ORDER — PHENYLEPHRINE 40 MCG/ML (10ML) SYRINGE FOR IV PUSH (FOR BLOOD PRESSURE SUPPORT)
PREFILLED_SYRINGE | INTRAVENOUS | Status: AC
  Filled 2017-04-09: qty 140

## 2017-04-09 MED ORDER — PRENATAL MULTIVITAMIN CH
1.0000 | ORAL_TABLET | Freq: Every day | ORAL | Status: DC
  Administered 2017-04-11 – 2017-04-12 (×2): 1 via ORAL
  Filled 2017-04-09 (×2): qty 1

## 2017-04-09 MED ORDER — OXYTOCIN 40 UNITS IN LACTATED RINGERS INFUSION - SIMPLE MED: 2.5000 [IU]/h | INTRAVENOUS | Status: AC

## 2017-04-09 MED ORDER — WITCH HAZEL-GLYCERIN EX PADS: 1.0000 "application " | MEDICATED_PAD | CUTANEOUS | Status: DC | PRN

## 2017-04-09 MED ORDER — LIDOCAINE-EPINEPHRINE (PF) 2 %-1:200000 IJ SOLN
INTRAMUSCULAR | Status: DC | PRN
  Administered 2017-04-09 (×2): 5 mL via EPIDURAL

## 2017-04-09 MED ORDER — MENTHOL 3 MG MT LOZG: 1.0000 | LOZENGE | OROMUCOSAL | Status: DC | PRN

## 2017-04-09 MED ORDER — MEDROXYPROGESTERONE ACETATE 150 MG/ML IM SUSP: 150.0000 mg | INTRAMUSCULAR | Status: DC | PRN

## 2017-04-09 MED ORDER — ONDANSETRON HCL 4 MG/2ML IJ SOLN
INTRAMUSCULAR | Status: AC
Start: 2017-04-09 — End: 2017-04-09
  Filled 2017-04-09: qty 4

## 2017-04-09 MED ORDER — NALBUPHINE HCL 10 MG/ML IJ SOLN
5.0000 mg | INTRAMUSCULAR | Status: DC | PRN
  Administered 2017-04-09: 5 mg via SUBCUTANEOUS
  Filled 2017-04-09: qty 1

## 2017-04-09 MED ORDER — DIPHENHYDRAMINE HCL 25 MG PO CAPS: 25.0000 mg | ORAL_CAPSULE | Freq: Four times a day (QID) | ORAL | Status: DC | PRN

## 2017-04-09 MED ORDER — SIMETHICONE 80 MG PO CHEW
80.0000 mg | CHEWABLE_TABLET | Freq: Three times a day (TID) | ORAL | Status: DC
  Administered 2017-04-09 – 2017-04-12 (×8): 80 mg via ORAL
  Filled 2017-04-09 (×8): qty 1

## 2017-04-09 MED ORDER — LACTATED RINGERS IV SOLN
INTRAVENOUS | Status: DC | PRN
  Administered 2017-04-09 (×3): via INTRAVENOUS

## 2017-04-09 MED ORDER — OXYTOCIN 10 UNIT/ML IJ SOLN
INTRAVENOUS | Status: DC | PRN
  Administered 2017-04-09: 40 [IU] via INTRAVENOUS

## 2017-04-09 MED ORDER — OXYCODONE-ACETAMINOPHEN 5-325 MG PO TABS
2.0000 | ORAL_TABLET | ORAL | Status: DC | PRN
  Filled 2017-04-09: qty 2

## 2017-04-09 MED ORDER — FAMOTIDINE 20 MG PO TABS
20.0000 mg | ORAL_TABLET | Freq: Every day | ORAL | Status: DC
  Administered 2017-04-09 – 2017-04-12 (×4): 20 mg via ORAL
  Filled 2017-04-09 (×4): qty 1

## 2017-04-09 MED ORDER — SIMETHICONE 80 MG PO CHEW
80.0000 mg | CHEWABLE_TABLET | ORAL | Status: DC
  Administered 2017-04-09 – 2017-04-12 (×3): 80 mg via ORAL
  Filled 2017-04-09 (×3): qty 1

## 2017-04-09 MED ORDER — SIMETHICONE 80 MG PO CHEW: 80.0000 mg | CHEWABLE_TABLET | ORAL | Status: DC | PRN

## 2017-04-09 MED ORDER — DEXTROSE IN LACTATED RINGERS 5 % IV SOLN: INTRAVENOUS | Status: DC

## 2017-04-09 MED ORDER — SCOPOLAMINE 1 MG/3DAYS TD PT72
MEDICATED_PATCH | TRANSDERMAL | Status: DC | PRN
  Administered 2017-04-09: 1 via TRANSDERMAL

## 2017-04-09 MED ORDER — OXYCODONE-ACETAMINOPHEN 5-325 MG PO TABS
1.0000 | ORAL_TABLET | ORAL | Status: DC | PRN
  Administered 2017-04-10 – 2017-04-12 (×8): 1 via ORAL
  Filled 2017-04-09 (×8): qty 1

## 2017-04-09 MED ORDER — SCOPOLAMINE 1 MG/3DAYS TD PT72
1.0000 | MEDICATED_PATCH | Freq: Once | TRANSDERMAL | Status: DC
  Filled 2017-04-09: qty 1

## 2017-04-09 NOTE — Consult Note (Signed)
Neonatology Note:   Attendance at C-section:    I was asked by Dr. Julien Girt to attend this primary C/S at 41 2/7 weeks due to fetal intolerance of labor and FTP The mother is a G2P0A1 A pos, GBS positive with a history of fibroids, a history of HSV (Valtrex) and AMA. ROM 16 hours prior to delivery, fluid clear. Mother was treated with Pen G > 4 hours before delivery. Infant vigorous with good spontaneous cry and tone. Delayed cord clamping was done. Needed only minimal bulb suctioning. Ap 9/10. Lungs clear to ausc in DR. To CN to care of Pediatrician.   Real Cons, MD

## 2017-04-09 NOTE — Progress Notes (Signed)
Subjective: Postpartum Day 0: Cesarean Delivery Patient reports incisional pain and tolerating PO.    Objective: Vital signs in last 24 hours: Temp:  [97.6 F (36.4 C)-98.9 F (37.2 C)] 97.8 F (36.6 C) (01/05 0730) Pulse Rate:  [73-159] 81 (01/05 0730) Resp:  [16-21] 20 (01/05 0730) BP: (94-134)/(50-98) 121/71 (01/05 0730) SpO2:  [96 %-100 %] 98 % (01/05 0730)  Physical Exam:  General: alert and cooperative Lochia: appropriate Uterine Fundus: firm Incision: healing well DVT Evaluation: No evidence of DVT seen on physical exam.  Recent Labs    04/09/17 0323 04/09/17 0549  HGB 12.2 12.4  HCT 35.9* 36.6    Assessment/Plan: Status post Cesarean section. Doing well postoperatively.  Continue current care.  Aimee Kerr 04/09/2017, 9:32 AM

## 2017-04-09 NOTE — Transfer of Care (Signed)
Immediate Anesthesia Transfer of Care Note  Patient: Aimee Kerr  Procedure(s) Performed: CESAREAN SECTION (N/A )  Patient Location: PACU  Anesthesia Type:Epidural  Level of Consciousness: awake, alert  and oriented  Airway & Oxygen Therapy: Patient Spontanous Breathing  Post-op Assessment: Report given to RN and Post -op Vital signs reviewed and stable BP 127/71, HR 108, RR 20, SaO2 100%  Post vital signs: Reviewed and stable  Last Vitals:  Vitals:   04/09/17 0030 04/09/17 0102  BP: (!) 109/54 (!) 94/50  Pulse: 88 (!) 159  Resp:    Temp:    SpO2: 99%     Last Pain:  Vitals:   04/08/17 2324  TempSrc:   PainSc: 3       Patients Stated Pain Goal: 2 (03/00/92 3300)  Complications: No apparent anesthesia complications

## 2017-04-09 NOTE — Progress Notes (Signed)
Pt comfortable w/ epidural.  Exam done a couple hours ago - pt had changed to 5.5cm and pitocin was continued.  However, pitocin was d/c 30 min ago b/c of recurrent deep late decelerations.  FHT now cat 1 off pitocin.  Cvx still 5.5 cm and unchanged.  Rec primary c-section given fetal intolerance of labor and arrest of dilation.  Pt agrees with plan of care.    R/b/a reviewed including risk of hemorrhage, transfusion and hysterectomy.  Discussed risk of infection & damage to surrounding organs.  Questions answered, informed consent

## 2017-04-09 NOTE — Op Note (Addendum)
Cesarean Section Procedure Note   Aimee Kerr  04/08/2017 - 04/09/2017  Indications: fetal intolerance to labor, arrest of dilation   Pre-operative Diagnosis: Arrest of dialtion, Fetal Intolerence to Labor.   Post-operative Diagnosis: Same   Surgeon: Juliann Mule) and Role:    * Marylynn Pearson, MD - Primary    * Elonda Husky Mertie Clause, MD - Assisting   Anesthesia: epidural   Procedure Details:  The patient was seen in the Holding Room. The risks, benefits, complications, treatment options, and expected outcomes were discussed with the patient. The patient concurred with the proposed plan, giving informed consent. identified as Aimee Kerr and the procedure verified as C-Section Delivery. A Time Out was held and the above information confirmed.  After induction of anesthesia, the patient was draped and prepped in the usual sterile manner. A transverse was made and carried down through the subcutaneous tissue to the fascia. Fascial incision was made and extended transversely. The fascia was separated from the underlying rectus tissue superiorly and inferiorly. The peritoneum was identified and entered. Peritoneal incision was extended longitudinally. The utero-vesical peritoneal reflection was incised transversely and the bladder flap was bluntly freed from the lower uterine segment. A low transverse uterine incision was made. Delivered from cephalic presentation was a viable female with Apgar scores of 9 at one minute and 10 at five minutes. Cord ph was not sent the umbilical cord was clamped and cut cord blood was obtained for evaluation. The placenta was removed Intact and appeared normal. The uterine outline, tubes and ovaries appeared abnormal - multiple large fibroids palpated}. The uterine incision was closed with running locked sutures of 0chromic gut.   Hemostasis was observed. Lavage was carried out until clear. The fascia was then reapproximated with running sutures of 0 PDS. The skin was closed  with 4-0Vicryl.   Instrument, sponge, and needle counts were correct prior the abdominal closure and were correct at the conclusion of the case.    Estimated Blood Loss: 1542 mL   Urine Output: blood tinged, similar to pre op  Complications: no complications  Disposition: PACU - hemodynamically stable.   Maternal Condition: stable   Baby condition / location:  Couplet care / Skin to Skin  Attending Attestation: I was present and scrubbed for the entire procedure.   Signed: Surgeon(s): Marylynn Pearson, MD Florian Buff, MD

## 2017-04-09 NOTE — Progress Notes (Signed)
Dr. Marcell Barlow notified of Patient's pain level of 6, experiencing nausea, unable to tolerate oral meds at this time.  Order received for 1,000 mg IV Tylenol.  Will continue to monitor.

## 2017-04-09 NOTE — Progress Notes (Signed)
CT results reviewed with pt - no evidence of bladder injury Inspection of foley shows blood tinged urine but no further clots  Ok to remove foley as able w/ good ambulation

## 2017-04-09 NOTE — Progress Notes (Signed)
CTSP d/t hematuria Upon evaluation of foley - urine appears bloody w/ particulate matter Concern for bladder injury discussed with patient CT pelvis w/ contrast ordered Patient agrees with plan of care

## 2017-04-09 NOTE — Anesthesia Postprocedure Evaluation (Signed)
Anesthesia Post Note  Patient: Aimee Kerr  Procedure(s) Performed: CESAREAN SECTION (N/A )     Patient location during evaluation: Mother Baby Anesthesia Type: Epidural Level of consciousness: awake and alert and oriented Pain management: pain level controlled Vital Signs Assessment: post-procedure vital signs reviewed and stable Respiratory status: spontaneous breathing and nonlabored ventilation Cardiovascular status: stable Postop Assessment: no headache, patient able to bend at knees, no backache, no apparent nausea or vomiting, epidural receding and adequate PO intake Anesthetic complications: no    Last Vitals:  Vitals:   04/09/17 0600 04/09/17 0730  BP: (!) 114/57 121/71  Pulse: 73 81  Resp: 19 20  Temp: 36.6 C 36.6 C  SpO2: 98% 98%    Last Pain:  Vitals:   04/09/17 0730  TempSrc:   PainSc: 0-No pain   Pain Goal: Patients Stated Pain Goal: 3 (04/09/17 0245)               Jabier Mutton

## 2017-04-10 MED ORDER — IBUPROFEN 600 MG PO TABS
600.0000 mg | ORAL_TABLET | Freq: Four times a day (QID) | ORAL | Status: DC | PRN
  Administered 2017-04-10 – 2017-04-12 (×8): 600 mg via ORAL
  Filled 2017-04-10 (×8): qty 1

## 2017-04-10 MED ORDER — ONDANSETRON HCL 4 MG PO TABS
4.0000 mg | ORAL_TABLET | Freq: Three times a day (TID) | ORAL | Status: DC | PRN
  Administered 2017-04-10 – 2017-04-12 (×4): 4 mg via ORAL
  Filled 2017-04-10 (×4): qty 1

## 2017-04-10 NOTE — Progress Notes (Signed)
Subjective: Postpartum Day 2: Cesarean Delivery Patient reports incisional pain, tolerating PO and no problems voiding.  No flatus yeg Pt reports she is not allergic to aspirin  Objective: Vital signs in last 24 hours: Temp:  [97.8 F (36.6 C)-98.8 F (37.1 C)] 98.8 F (37.1 C) (01/06 0610) Pulse Rate:  [63-83] 83 (01/06 0610) Resp:  [18-20] 20 (01/06 0610) BP: (88-122)/(51-72) 122/72 (01/06 0610) SpO2:  [96 %-100 %] 100 % (01/06 0610)  Physical Exam:  General: alert and cooperative Lochia: appropriate Uterine Fundus: firm Incision: healing well, no significant drainage DVT Evaluation: No evidence of DVT seen on physical exam.  Recent Labs    04/09/17 0549 04/09/17 1131  HGB 12.4 12.8  HCT 36.6 37.7    Assessment/Plan: Status post Cesarean section. Doing well postoperatively.  Continue current care. Will order ibuprofen to help pain control Increase ambulation, chewing gum, and warm beverages to assist GI function  Marylynn Pearson 04/10/2017, 8:48 AM

## 2017-04-11 NOTE — Progress Notes (Signed)
Subjective: Postpartum Day 2: Cesarean Delivery Patient reports incisional pain.    Objective: Vital signs in last 24 hours: Temp:  [98 F (36.7 C)-98.6 F (37 C)] 98.6 F (37 C) (01/07 0500) Pulse Rate:  [65-77] 65 (01/07 0500) Resp:  [16-18] 16 (01/07 0500) BP: (104-115)/(60-70) 115/70 (01/07 0500) SpO2:  [99 %] 99 % (01/07 0500)  Physical Exam:  General: alert Lochia: appropriate Uterine Fundus: firm Incision: healing well DVT Evaluation: No evidence of DVT seen on physical exam.  Recent Labs    04/09/17 0549 04/09/17 1131  HGB 12.4 12.8  HCT 36.6 37.7    Assessment/Plan: Status post Cesarean section. Doing well postoperatively.  Continue current care Plan D/C in AM.  Margarette Asal 04/11/2017, 8:59 AM

## 2017-04-11 NOTE — Progress Notes (Signed)
abd very distended/states not passing flatus   Have instructed her to walk in halls for 2 days now and she makes an excuse not to.. Instructed her that will help her pass gas and to increase warm fluids

## 2017-04-12 LAB — TYPE AND SCREEN
ABO/RH(D): A POS
ANTIBODY SCREEN: NEGATIVE
UNIT DIVISION: 0
Unit division: 0

## 2017-04-12 LAB — BPAM RBC
Blood Product Expiration Date: 201901212359
Blood Product Expiration Date: 201901212359
UNIT TYPE AND RH: 6200
Unit Type and Rh: 6200

## 2017-04-12 MED ORDER — OXYCODONE-ACETAMINOPHEN 5-325 MG PO TABS: 1.0000 | ORAL_TABLET | Freq: Four times a day (QID) | ORAL | 0 refills | Status: DC | PRN

## 2017-04-12 MED ORDER — ONDANSETRON HCL 4 MG PO TABS: 4.0000 mg | ORAL_TABLET | Freq: Three times a day (TID) | ORAL | 0 refills | Status: DC | PRN

## 2017-04-12 MED ORDER — IBUPROFEN 600 MG PO TABS: 600.0000 mg | ORAL_TABLET | Freq: Four times a day (QID) | ORAL | 0 refills | Status: DC | PRN

## 2017-04-12 NOTE — Discharge Summary (Signed)
Obstetric Discharge Summary Reason for Admission: induction of labor Prenatal Procedures: none Intrapartum Procedures: cesarean: low cervical, transverse Postpartum Procedures: none Complications-Operative and Postpartum: none Hemoglobin  Date Value Ref Range Status  04/09/2017 12.8 12.0 - 15.0 g/dL Final   HCT  Date Value Ref Range Status  04/09/2017 37.7 36.0 - 46.0 % Final    Physical Exam:  General: alert, cooperative and no distress Lochia: appropriate Uterine Fundus: firm Incision: healing well DVT Evaluation: No evidence of DVT seen on physical exam.  Discharge Diagnoses: Term Pregnancy-delivered  Discharge Information: Date: 04/12/2017 Activity: pelvic rest Diet: routine Medications: PNV, Ibuprofen and Percocet Condition: stable Instructions: refer to practice specific booklet Discharge to: home   Newborn Data: Live born female  Birth Weight: 8 lb 3 oz (3715 g) APGAR: 96, 10  Newborn Delivery   Birth date/time:  04/09/2017 01:42:00 Delivery type:  C-Section, Low Transverse C-section categorization:  Primary     Home with mother.  Shon Millet II 04/12/2017, 7:57 AM

## 2017-06-12 ENCOUNTER — Encounter (HOSPITAL_COMMUNITY): Payer: Self-pay

## 2017-06-12 ENCOUNTER — Emergency Department (HOSPITAL_COMMUNITY)
Admission: EM | Admit: 2017-06-12 | Discharge: 2017-06-12 | Disposition: A | Discharge status: Home / Self Care | Payer: BLUE CROSS/BLUE SHIELD | Attending: Emergency Medicine | Admitting: Emergency Medicine

## 2017-06-12 ENCOUNTER — Other Ambulatory Visit: Payer: Self-pay

## 2017-06-12 ENCOUNTER — Emergency Department (HOSPITAL_COMMUNITY): Payer: BLUE CROSS/BLUE SHIELD

## 2017-06-12 DIAGNOSIS — R110 Nausea: Secondary | ICD-10-CM

## 2017-06-12 DIAGNOSIS — K5900 Constipation, unspecified: Secondary | ICD-10-CM | POA: Insufficient documentation

## 2017-06-12 DIAGNOSIS — Z87891 Personal history of nicotine dependence: Secondary | ICD-10-CM | POA: Insufficient documentation

## 2017-06-12 DIAGNOSIS — Z86018 Personal history of other benign neoplasm: Secondary | ICD-10-CM

## 2017-06-12 DIAGNOSIS — Z8742 Personal history of other diseases of the female genital tract: Secondary | ICD-10-CM | POA: Insufficient documentation

## 2017-06-12 DIAGNOSIS — N898 Other specified noninflammatory disorders of vagina: Secondary | ICD-10-CM | POA: Insufficient documentation

## 2017-06-12 DIAGNOSIS — R1031 Right lower quadrant pain: Secondary | ICD-10-CM

## 2017-06-12 LAB — CBC WITH DIFFERENTIAL/PLATELET
Basophils Absolute: 0 10*3/uL (ref 0.0–0.1)
Basophils Relative: 1 %
EOS ABS: 0.1 10*3/uL (ref 0.0–0.7)
Eosinophils Relative: 2 %
HEMATOCRIT: 41.3 % (ref 36.0–46.0)
HEMOGLOBIN: 13.3 g/dL (ref 12.0–15.0)
LYMPHS ABS: 1.4 10*3/uL (ref 0.7–4.0)
LYMPHS PCT: 42 %
MCH: 29 pg (ref 26.0–34.0)
MCHC: 32.2 g/dL (ref 30.0–36.0)
MCV: 90 fL (ref 78.0–100.0)
MONOS PCT: 8 %
Monocytes Absolute: 0.3 10*3/uL (ref 0.1–1.0)
NEUTROS PCT: 47 %
Neutro Abs: 1.6 10*3/uL — ABNORMAL LOW (ref 1.7–7.7)
Platelets: 212 10*3/uL (ref 150–400)
RBC: 4.59 MIL/uL (ref 3.87–5.11)
RDW: 13.3 % (ref 11.5–15.5)
WBC: 3.4 10*3/uL — AB (ref 4.0–10.5)

## 2017-06-12 LAB — URINALYSIS, ROUTINE W REFLEX MICROSCOPIC
Bilirubin Urine: NEGATIVE
GLUCOSE, UA: NEGATIVE mg/dL
Hgb urine dipstick: NEGATIVE
Ketones, ur: NEGATIVE mg/dL
Leukocytes, UA: NEGATIVE
Nitrite: NEGATIVE
PH: 5 (ref 5.0–8.0)
PROTEIN: NEGATIVE mg/dL
Specific Gravity, Urine: 1.023 (ref 1.005–1.030)

## 2017-06-12 LAB — COMPREHENSIVE METABOLIC PANEL
ALK PHOS: 65 U/L (ref 38–126)
ALT: 13 U/L — AB (ref 14–54)
ANION GAP: 9 (ref 5–15)
AST: 17 U/L (ref 15–41)
Albumin: 3.9 g/dL (ref 3.5–5.0)
BILIRUBIN TOTAL: 0.5 mg/dL (ref 0.3–1.2)
BUN: 16 mg/dL (ref 6–20)
CALCIUM: 8.8 mg/dL — AB (ref 8.9–10.3)
CO2: 26 mmol/L (ref 22–32)
CREATININE: 1.04 mg/dL — AB (ref 0.44–1.00)
Chloride: 108 mmol/L (ref 101–111)
Glucose, Bld: 90 mg/dL (ref 65–99)
Potassium: 3.6 mmol/L (ref 3.5–5.1)
SODIUM: 143 mmol/L (ref 135–145)
TOTAL PROTEIN: 7 g/dL (ref 6.5–8.1)

## 2017-06-12 LAB — WET PREP, GENITAL
Clue Cells Wet Prep HPF POC: NONE SEEN
SPERM: NONE SEEN
TRICH WET PREP: NONE SEEN
YEAST WET PREP: NONE SEEN

## 2017-06-12 LAB — I-STAT BETA HCG BLOOD, ED (MC, WL, AP ONLY): I-stat hCG, quantitative: <5 m[IU]/mL (ref <5)

## 2017-06-12 LAB — LIPASE, BLOOD: LIPASE: 23 U/L (ref 11–51)

## 2017-06-12 MED ORDER — SODIUM CHLORIDE 0.9 % IJ SOLN
INTRAMUSCULAR | Status: AC
  Filled 2017-06-12: qty 50

## 2017-06-12 MED ORDER — ONDANSETRON HCL 4 MG PO TABS: 4.0000 mg | ORAL_TABLET | Freq: Four times a day (QID) | ORAL | 0 refills | Status: DC

## 2017-06-12 MED ORDER — AZITHROMYCIN 250 MG PO TABS
1000.0000 mg | ORAL_TABLET | Freq: Once | ORAL | Status: AC
  Administered 2017-06-12: 1000 mg via ORAL
  Filled 2017-06-12: qty 4

## 2017-06-12 MED ORDER — CEFTRIAXONE SODIUM 250 MG IJ SOLR
250.0000 mg | Freq: Once | INTRAMUSCULAR | Status: AC
  Administered 2017-06-12: 250 mg via INTRAMUSCULAR
  Filled 2017-06-12: qty 250

## 2017-06-12 MED ORDER — MORPHINE SULFATE (PF) 4 MG/ML IV SOLN
4.0000 mg | Freq: Once | INTRAVENOUS | Status: AC
  Administered 2017-06-12: 4 mg via INTRAVENOUS
  Filled 2017-06-12: qty 1

## 2017-06-12 MED ORDER — IOPAMIDOL (ISOVUE-300) INJECTION 61%
INTRAVENOUS | Status: AC
  Administered 2017-06-12: 100 mL
  Filled 2017-06-12: qty 100

## 2017-06-12 MED ORDER — ONDANSETRON 4 MG PO TBDP
4.0000 mg | ORAL_TABLET | Freq: Once | ORAL | Status: AC
  Administered 2017-06-12: 4 mg via ORAL
  Filled 2017-06-12: qty 1

## 2017-06-12 MED ORDER — OXYCODONE-ACETAMINOPHEN 5-325 MG PO TABS: 1.0000 | ORAL_TABLET | Freq: Three times a day (TID) | ORAL | 0 refills | Status: DC | PRN

## 2017-06-12 MED ORDER — OXYCODONE-ACETAMINOPHEN 5-325 MG PO TABS
1.0000 | ORAL_TABLET | Freq: Once | ORAL | Status: AC
  Administered 2017-06-12: 1 via ORAL
  Filled 2017-06-12: qty 1

## 2017-06-12 MED ORDER — LIDOCAINE HCL 1 % IJ SOLN
INTRAMUSCULAR | Status: AC
  Administered 2017-06-12: 2 mL
  Filled 2017-06-12: qty 20

## 2017-06-12 NOTE — Discharge Instructions (Signed)
Please call and schedule a follow-up appointment in the next 3-4 days with your OB/GYN regarding vaginal discharge you have been having.  Please call and schedule follow-up appointment with your primary care clinician   Please follow up with primary care regarding the area on your spleen was was recommended for further evaluation with MRI.   Take 1 tablet of Zofran and let it dissolve under the tongue every 8 hours as needed for nausea and vomiting.  For mild to moderate pain, take 650 mg of Tylenol or 600 mg of ibuprofen with food every 6 hours as needed for pain control.  For severe pain, take 1 tablet of Percocet every 8 hours.  Please note, this medication is a narcotic and can be addicting.  You should also not drive or work while taking this medication because it can cause you to be impaired.  If you develop new or worsening symptoms, including severe uncontrollable abdominal or pelvic pain, high fever that does not improve with Tylenol or ibuprofen, vomiting despite taking Zofran, or other new concerning symptoms, please return to the emergency department for re-evaluation.

## 2017-06-12 NOTE — ED Triage Notes (Signed)
States right flank pain for one week with nausea at home no dysuria voiced.

## 2017-06-12 NOTE — ED Provider Notes (Signed)
Portal DEPT Provider Note   CSN: [id number] Arrival date & time: 06/12/17  0417     History   Chief Complaint Chief Complaint  Patient presents with  . Flank Pain    HPI Aimee Kerr is a 42 y.o. female with a history of fibroids and HSV who presents to the emergency department with a chief complaint of right flank pain.  She characterizes the pain as sharp and was initially intermittent when it began 1 week ago, but over the last 2 days it has become constant.  She reports associated chills, constipation, and yellow/clear vaginal discharge that began 1 week ago. Last BM was 2 days ago, small and hard.    She denies fever, dysuria, hematuria, vaginal bleeding, diarrhea, chest pain, back pain, or rash.   She reports that she had a C-section performed on 04/08/17.  She denies operative or postpartum complications.  She is not currently breast-feeding.  No history of nephrolithiasis or pyelonephritis.  The history is provided by the patient. No language interpreter was used.    Past Medical History:  Diagnosis Date  . AMA (advanced maternal age) primigravida 84+, third trimester   . Fibroid   . Fibroids   . HSV (herpes simplex virus) anogenital infection   . Migraines     Patient Active Problem List   Diagnosis Date Noted  . S/P cesarean section 04/09/2017  . Fibroids 04/08/2017    Past Surgical History:  Procedure Laterality Date  . CESAREAN SECTION N/A 04/08/2017   Procedure: CESAREAN SECTION;  Surgeon: Marylynn Pearson, MD;  Location: [place];  Service: Obstetrics;  Laterality: N/A;  . WISDOM TOOTH EXTRACTION      OB History    Gravida Para Term Preterm AB Living   2 1 1   1 1    SAB TAB Ectopic Multiple Live Births   1     0 1       Home Medications    Prior to Admission medications   Medication Sig Start Date End Date Taking? Authorizing Provider  ibuprofen (ADVIL,MOTRIN) 200 MG tablet Take 800-1,600 mg by  mouth 2 (two) times daily as needed for moderate pain.   Yes [provider]  Omega-3 Fatty Acids (FISH OIL) 1200 MG CAPS Take 1 capsule by mouth daily.   Yes [provider]  Prenatal Vit-Fe Fumarate-FA (MULTIVITAMIN-PRENATAL) 27-0.8 MG TABS tablet Take 1 tablet by mouth daily at 12 noon.   Yes [provider]  triamcinolone cream (KENALOG) 0.5 % Apply 1 application topically daily as needed (eczema).   Yes [provider]  ibuprofen (ADVIL,MOTRIN) 600 MG tablet Take 1 tablet (600 mg total) by mouth every 6 (six) hours as needed for moderate pain. Patient not taking: Reported on 06/12/2017 04/12/17   Everlene Farrier, MD  ondansetron (ZOFRAN) 4 MG tablet Take 1 tablet (4 mg total) by mouth every 6 (six) hours. 06/12/17   McDonald, Mia A, PA-C  oxyCODONE-acetaminophen (PERCOCET/ROXICET) 5-325 MG tablet Take 1 tablet by mouth every 8 (eight) hours as needed for severe pain. 06/12/17   McDonald, Mia A, PA-C    Family History Family History  Problem Relation Age of Onset  . Heart disease Mother   . Heart failure Mother   . Lupus Sister   . Hypertension Maternal Aunt   . Heart failure Maternal Aunt   . Seizures Maternal Aunt   . Breast cancer Maternal Aunt   . Rheum arthritis Maternal Aunt   . Colon  cancer Maternal Uncle   . Diabetes Maternal Grandmother     Social History Social History   Tobacco Use  . Smoking status: Former Research scientist (life sciences)  . Smokeless tobacco: Never Used  Substance Use Topics  . Alcohol use: No  . Drug use: No     Allergies   Other; Asa [aspirin]; Codeine; Formaldehyde; and Sulfa antibiotics   Review of Systems Review of Systems  Constitutional: Negative for activity change, chills and fever.  HENT: Negative for congestion and sore throat.   Eyes: Negative for visual disturbance.  Respiratory: Negative for shortness of breath.   Cardiovascular: Negative for chest pain.  Gastrointestinal: Positive for constipation. Negative for  abdominal pain, anal bleeding, blood in stool, diarrhea, nausea and vomiting.  Genitourinary: Positive for flank pain and vaginal discharge. Negative for dysuria, frequency, hematuria, vaginal bleeding and vaginal pain.  Musculoskeletal: Negative for back pain and myalgias.  Skin: Negative for rash.  Allergic/Immunologic: Negative for immunocompromised state.  Neurological: Negative for weakness and headaches.  Psychiatric/Behavioral: Negative for confusion.     Physical Exam Updated Vital Signs BP 120/85 (BP Location: Left Arm)   Pulse 72   Temp 97.6 F (36.4 C) (Oral)   Resp 16   Ht 5\' 2"  (1.575 m)   Wt 77.1 kg (170 lb)   SpO2 100%   BMI 31.09 kg/m   Physical Exam  Constitutional: She appears well-developed and well-nourished. No distress.  HENT:  Head: Normocephalic.  Eyes: Conjunctivae are normal. No scleral icterus.  Neck: Neck supple.  Cardiovascular: Normal rate, regular rhythm and intact distal pulses. Exam reveals no gallop and no friction rub.  No murmur heard. Pulmonary/Chest: Effort normal. No stridor. No respiratory distress. She has no wheezes. She has no rales. She exhibits no tenderness.  Abdominal: Soft. Bowel sounds are normal. She exhibits no distension and no mass. There is tenderness. There is rebound and guarding. No hernia.  Tender to palpation in the right lower quadrant with tenderness over McBurney's point.  She is also mildly tender to palpation in the right upper quadrant.  No left-sided tenderness to palpation.  No CVA tenderness bilaterally.  Bilateral flanks are nontender to palpation.  There is a well-healed surgical incision over the anterior abdomen.  Genitourinary: Vaginal discharge found.  Genitourinary Comments: Chaperoned pelvic exam.  No cervical motion tenderness.  She has right adnexal tenderness without palpable mass.  No left adnexal tenderness.  Copious amounts of thick yellow discharge is present.   Musculoskeletal: Normal range of  motion. She exhibits no edema, tenderness or deformity.  Neurological: She is alert.  Skin: Skin is warm. Capillary refill takes less than 2 seconds. No rash noted. She is not diaphoretic. No erythema. No pallor.  Psychiatric: Her behavior is normal.  Nursing note and vitals reviewed.   ED Treatments / Results  Labs (all labs ordered are listed, but only abnormal results are displayed) Labs Reviewed  WET PREP, GENITAL - Abnormal; Notable for the following components:      Result Value   WBC, Wet Prep HPF POC MANY (*)    All other components within normal limits  CBC WITH DIFFERENTIAL/PLATELET - Abnormal; Notable for the following components:   WBC 3.4 (*)    Neutro Abs 1.6 (*)    All other components within normal limits  COMPREHENSIVE METABOLIC PANEL - Abnormal; Notable for the following components:   Creatinine, Ser 1.04 (*)    Calcium 8.8 (*)    ALT 13 (*)    All other  components within normal limits  URINALYSIS, ROUTINE W REFLEX MICROSCOPIC  LIPASE, BLOOD  I-STAT BETA HCG BLOOD, ED ([facility], [facility], [facility] ONLY)  GC/CHLAMYDIA PROBE AMP ([place]) NOT AT [facility]    EKG  EKG Interpretation None       Radiology Ct Abdomen Pelvis W Contrast  Result Date: 06/12/2017 CLINICAL DATA:  Right flank pain, nausea EXAM: CT ABDOMEN AND PELVIS WITH CONTRAST TECHNIQUE: Multidetector CT imaging of the abdomen and pelvis was performed using the standard protocol following bolus administration of intravenous contrast. CONTRAST:  141mL ISOVUE-300 IOPAMIDOL (ISOVUE-300) INJECTION 61% COMPARISON:  04/09/2017 FINDINGS: Lower chest: No acute abnormality. Hepatobiliary: Small low-density lesion centrally in the right hepatic lobe, likely small cysts. Gallbladder unremarkable. Pancreas: No focal abnormality or ductal dilatation. Spleen: Focal solid-appearing low-density lesion within the spleen measures 3.2 cm, new since prior study. Adrenals/Urinary Tract: No adrenal abnormality. No focal renal abnormality. No  stones or hydronephrosis. Urinary bladder is unremarkable. Stomach/Bowel: Appendix is normal. Stomach, large and small bowel grossly unremarkable. Vascular/Lymphatic: No evidence of aneurysm or adenopathy. Reproductive: Markedly enlarged uterus with numerous presumed fibroids. Large mass in the left side of the uterus measures up to 10 cm. There is an exophytic mass extending off the right fundus and extending up to the liver edge measuring up to approximately 9 cm. This has decreased in size since prior study. Other: Small amount of free fluid adjacent to the exophytic uterine mass extending up to the liver. No free air. Musculoskeletal: No acute bony abnormality. IMPRESSION: Markedly enlarged fibroid uterus. Large is masses in the left side of the uterus measuring 10 cm. Exophytic mass off the right fundus extends to the liver and measures up to 9 cm, decreased from 16 cm since prior study. This presumably represents an exophytic pedunculated fibroid. New solid-appearing low-density lesion centrally within the spleen, 3.2 cm. This is of unknown etiology. This could be further evaluated with abdominal MRI. Normal appendix. Electronically Signed   By: Rolm Baptise M.D.   On: 06/12/2017 09:33    Procedures Procedures (including critical care time)  Medications Ordered in ED Medications  ondansetron (ZOFRAN-ODT) disintegrating tablet 4 mg (4 mg Oral Given 06/12/17 0736)  morphine 4 MG/ML injection 4 mg (4 mg Intravenous Given 06/12/17 0736)  iopamidol (ISOVUE-300) 61 % injection (100 mLs  Contrast Given 06/12/17 0911)  cefTRIAXone (ROCEPHIN) injection 250 mg (250 mg Intramuscular Given 06/12/17 1303)  azithromycin (ZITHROMAX) tablet 1,000 mg (1,000 mg Oral Given 06/12/17 1302)  oxyCODONE-acetaminophen (PERCOCET/ROXICET) 5-325 MG per tablet 1 tablet (1 tablet Oral Given 06/12/17 1303)  lidocaine (XYLOCAINE) 1 % (with pres) injection (2 mLs  Given 06/12/17 1303)     Initial Impression / Assessment and Plan /  ED Course  I have reviewed the triage vital signs and the nursing notes.  Pertinent labs & imaging results that were available during my care of the patient were reviewed by me and considered in my medical decision making (see chart for details).     42 year old female with a history of fibroids and HSV who presents to the emergency department with a chief complaint of right flank pain.  On physical exam, she has no flank tenderness, but has significant tenderness in the right lower quadrant with tenderness over McBurney's point and rebound tenderness, concerning for appendicitis.  No leukocytosis on CBC.  UA is unremarkable.  Pregnancy test is negative.  CMP is unremarkable and reassuring.  Pain controlled with morphine in the ED.  CT scan with markedly enlarged fibroid  uterus with an exophytic mass of the right fundus that extends to the liver and measures up to 9 cm; however, this is decreased from 16 cm on CT performed on 04/09/17.  There is also a new solid appearing low-density lesion centrally within the spleen measuring 3.2 cm that may warrant further evaluation with abdominal MRI, these findings were discussed with the patient.  Appendix is normal.  On pelvic exam, copious amounts of yellow discharge is present in the vaginal vault with right adnexal tenderness.  No cervical motion tenderness.  Wet prep with many WBCs, but is otherwise unremarkable.  GC chlamydia are pending.  The patient was prophylactically treated with Rocephin and azithromycin in the ED.  Will discharge the patient home with follow-up to OB in the next 3-4 days for reevaluation of vaginal discharge, Percocet for pain control, and follow-up with PCP regarding new lesion located on the spleen.  The patient is agreeable with the plan at this time.  A 48-month prescription history query was performed using the [place] CSRS prior to discharge. Strict return precautions given.  She is hemodynamically stable and in no acute distress.  The  patient is safe for discharge home at this time.  Final Clinical Impressions(s) / ED Diagnoses   Final diagnoses:  Right lower quadrant abdominal pain  Nausea  History of uterine fibroid    ED Discharge Orders        Ordered    oxyCODONE-acetaminophen (PERCOCET/ROXICET) 5-325 MG tablet  Every 8 hours PRN     06/12/17 1224    ondansetron (ZOFRAN) 4 MG tablet  Every 6 hours     06/12/17 [place], Laymond Purser, PA-C 06/12/17 1729    Isla Pence, MD 06/14/17 [phone]

## 2017-06-12 NOTE — ED Notes (Signed)
Pelvic cart at bedside. 

## 2017-06-13 LAB — GC/CHLAMYDIA PROBE AMP (~~LOC~~) NOT AT ARMC
CHLAMYDIA, DNA PROBE: NEGATIVE
Neisseria Gonorrhea: NEGATIVE

## 2019-06-21 DIAGNOSIS — G43909 Migraine, unspecified, not intractable, without status migrainosus: Secondary | ICD-10-CM | POA: Insufficient documentation

## 2019-06-25 ENCOUNTER — Other Ambulatory Visit: Payer: Self-pay | Admitting: Obstetrics and Gynecology

## 2019-06-25 DIAGNOSIS — R928 Other abnormal and inconclusive findings on diagnostic imaging of breast: Secondary | ICD-10-CM

## 2019-07-12 ENCOUNTER — Ambulatory Visit
Admission: RE | Admit: 2019-07-12 | Discharge: 2019-07-12 | Disposition: A | Discharge status: Home / Self Care | Payer: 59 | Source: Ambulatory Visit | Attending: Obstetrics and Gynecology | Admitting: Obstetrics and Gynecology

## 2019-07-12 ENCOUNTER — Other Ambulatory Visit: Payer: Self-pay | Admitting: Obstetrics and Gynecology

## 2019-07-12 ENCOUNTER — Other Ambulatory Visit: Payer: Self-pay

## 2019-07-12 DIAGNOSIS — R928 Other abnormal and inconclusive findings on diagnostic imaging of breast: Secondary | ICD-10-CM

## 2019-07-12 DIAGNOSIS — N6489 Other specified disorders of breast: Secondary | ICD-10-CM

## 2020-01-17 ENCOUNTER — Ambulatory Visit
Admission: RE | Admit: 2020-01-17 | Discharge: 2020-01-17 | Disposition: A | Discharge status: Home / Self Care | Payer: 59 | Source: Ambulatory Visit | Attending: Obstetrics and Gynecology | Admitting: Obstetrics and Gynecology

## 2020-01-17 ENCOUNTER — Other Ambulatory Visit: Payer: Self-pay

## 2020-01-17 ENCOUNTER — Other Ambulatory Visit: Payer: Self-pay | Admitting: Obstetrics and Gynecology

## 2020-01-17 DIAGNOSIS — N6489 Other specified disorders of breast: Secondary | ICD-10-CM

## 2021-01-03 IMAGING — MG MM DIGITAL DIAGNOSTIC UNILAT*R* W/ TOMO W/ CAD
8 of 14 series · 9 of 40 positions shown · non-contrast
Comparison: Previous exam(s).

CLINICAL DATA: 44-year-old female presenting for short-term
follow-up of a probably benign right breast asymmetry.

EXAM:
DIGITAL DIAGNOSTIC UNILATERAL RIGHT MAMMOGRAM WITH TOMO AND CAD

[R ML synth-2D (1 of 2)]
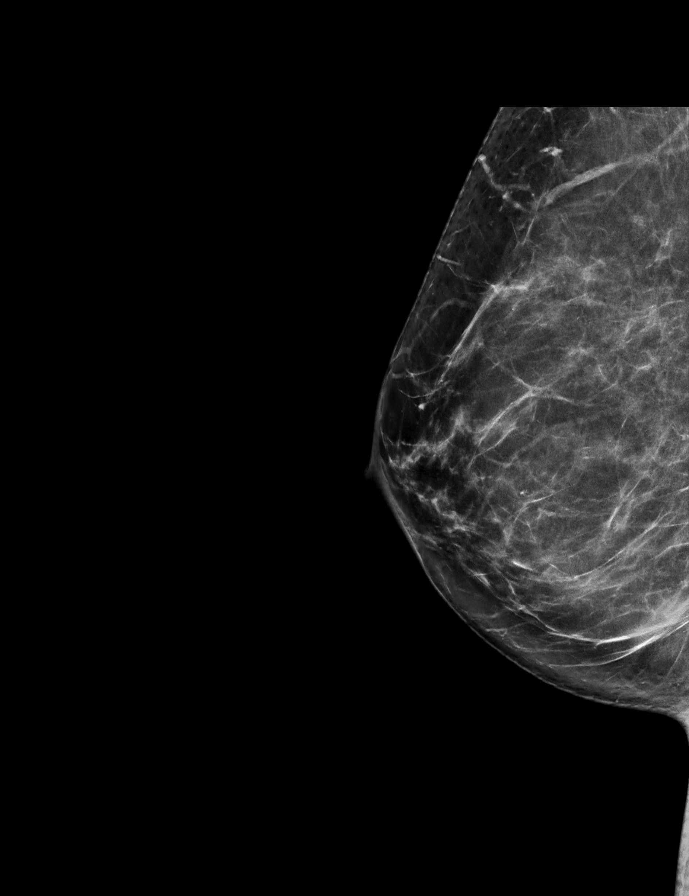

[R MLO synth-2D]
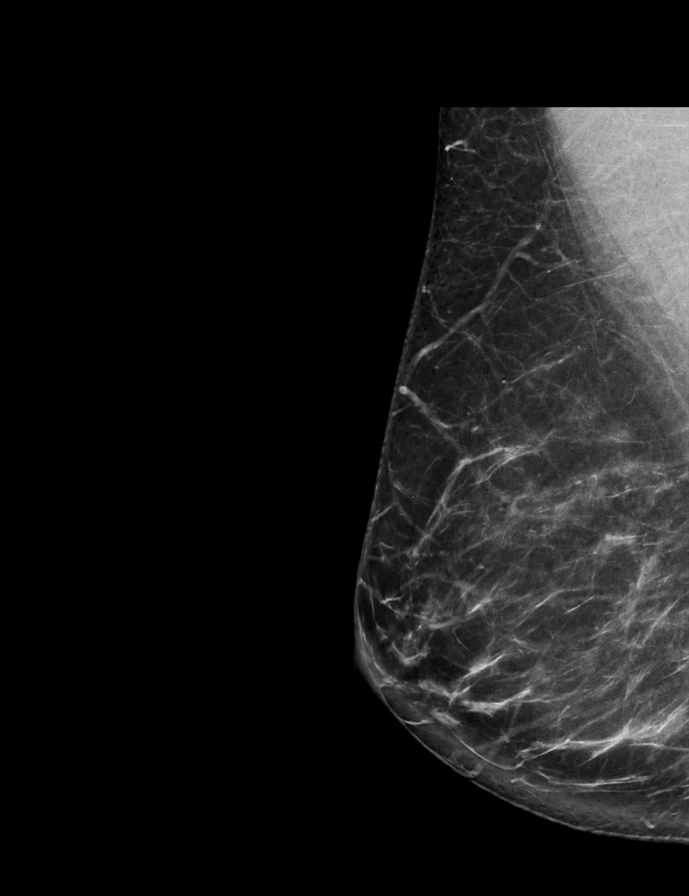

[R CC synth-2D (1 of 4)]
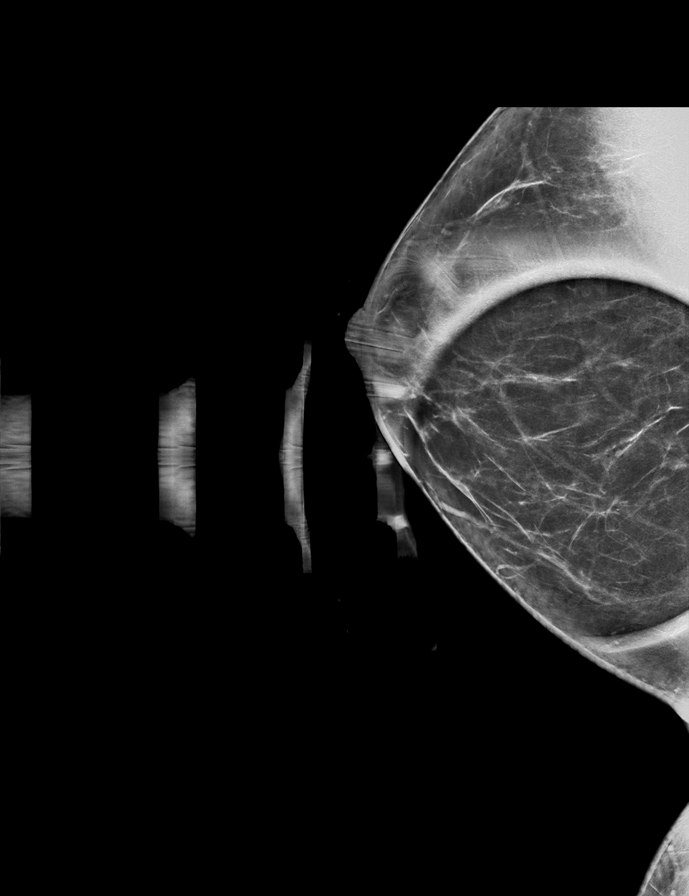

[R ML synth-2D (2 of 2)]
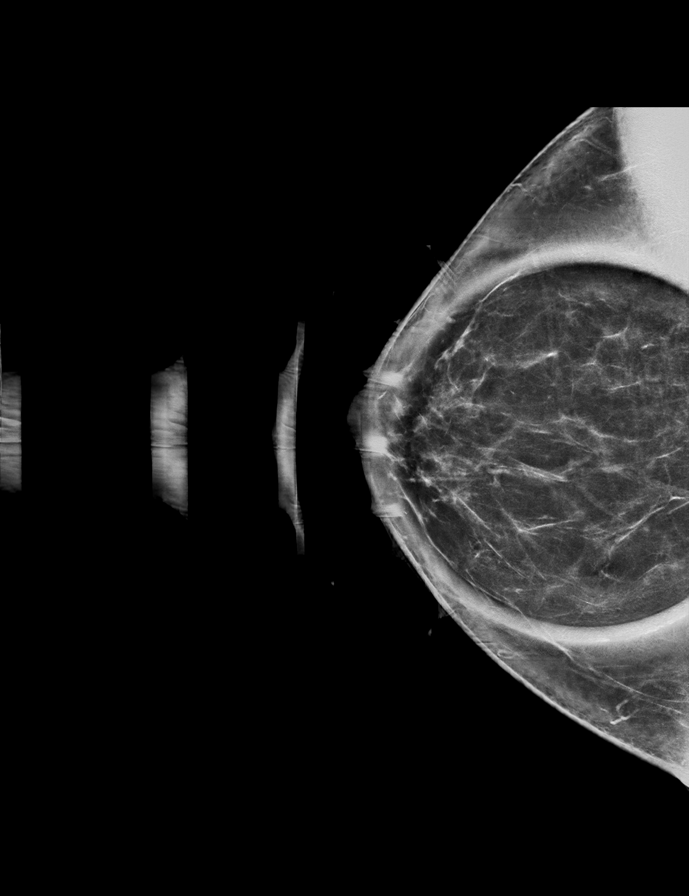

[R CC synth-2D (2 of 4)]
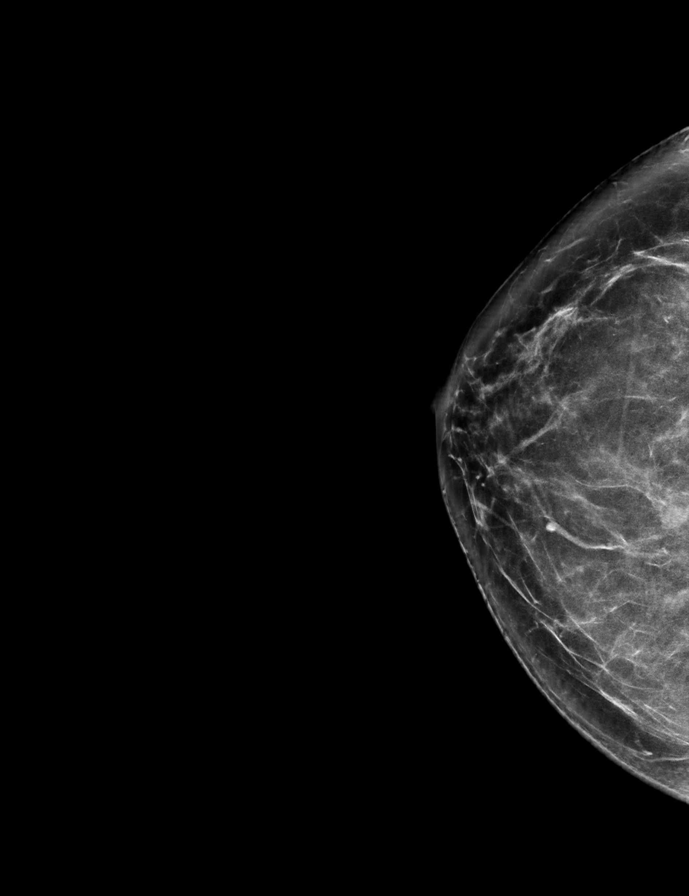

[R CC synth-2D (3 of 4)]
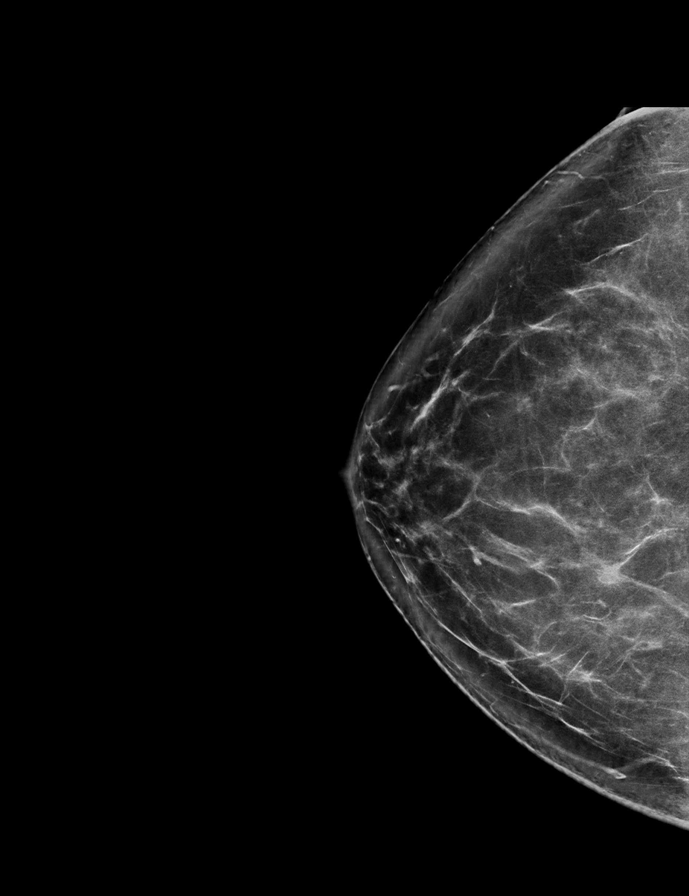

[R CC synth-2D (4 of 4)]
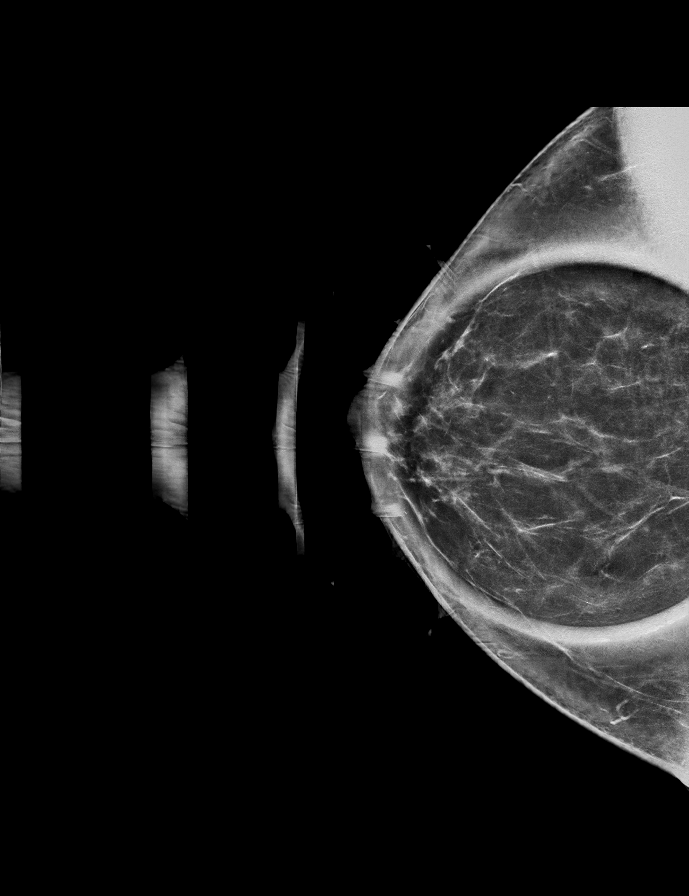

[R CC tomo · 2 of 79 frames shown]
[frame 26/79]
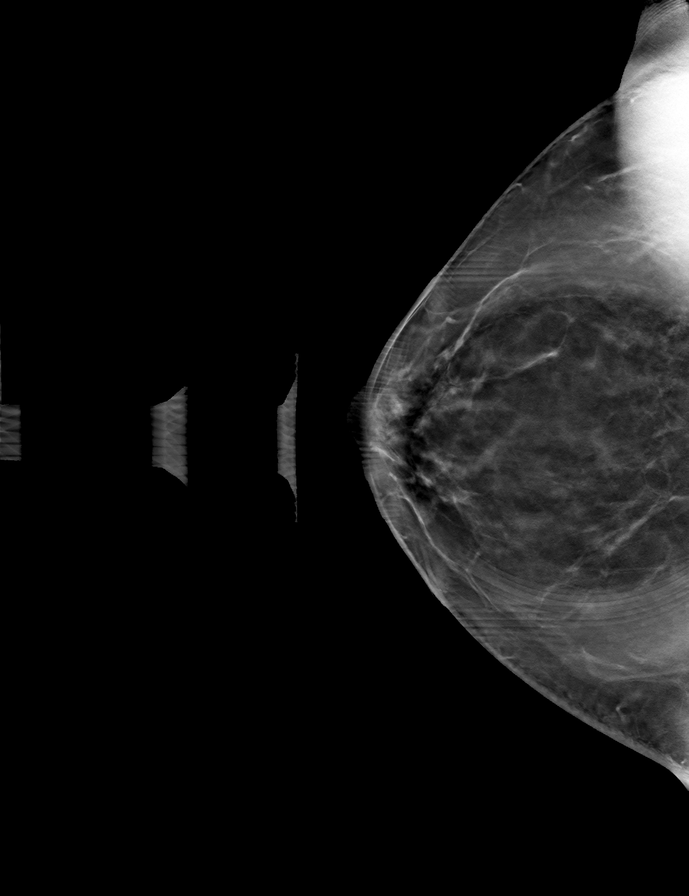
[frame 40/79]
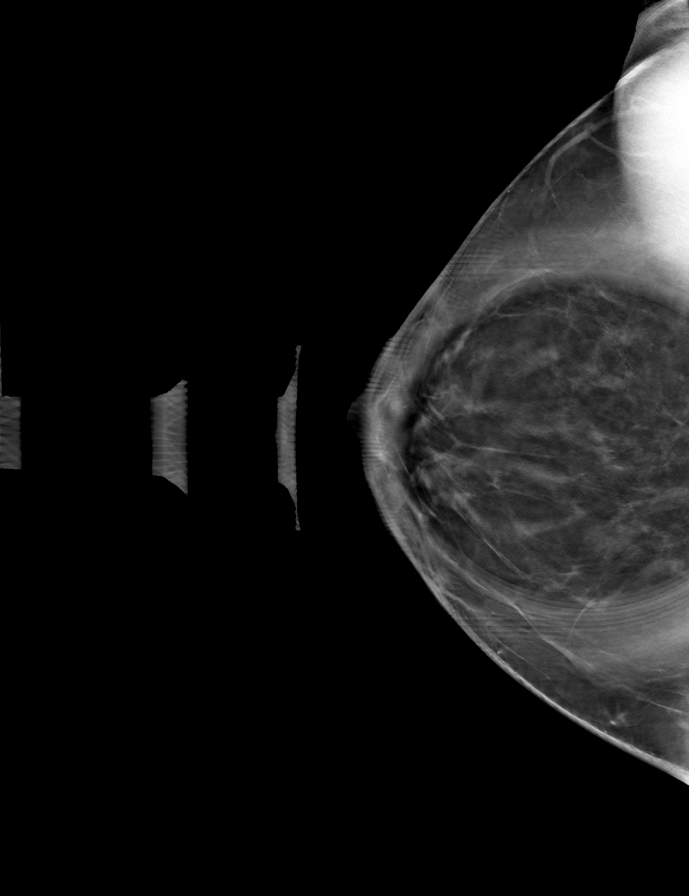

[9 of 40 positions shown; findings below may reference images not displayed]

ACR Breast Density Category b: There are scattered areas of
fibroglandular density.
FINDINGS: Full field and spot compression tomosynthesis views of the right
breast were performed. There is a stable asymmetry seen best on the
MLO view (image 29/80) in the medial breast posterior depth. An
additional possible asymmetry was identified in the lower inner
right breast which does not persist on the spot imaging, consistent
with normal overlapping fibroglandular tissue. There are no
additional new findings in the right breast.

Mammographic images were processed with CAD.
IMPRESSION: Stable probably benign right breast asymmetry, most likely normal
glandular tissue.

RECOMMENDATION:
Diagnostic bilateral mammogram in 6 months to confirm 1 year
stability of the probably benign right breast asymmetry and for
annual exam of the left breast.

I have discussed the findings and recommendations with the patient.
If applicable, a reminder letter will be sent to the patient
regarding the next appointment.

BI-RADS CATEGORY  3: Probably benign.

## 2021-01-12 ENCOUNTER — Other Ambulatory Visit: Payer: Self-pay | Admitting: Obstetrics and Gynecology

## 2021-01-12 DIAGNOSIS — N6489 Other specified disorders of breast: Secondary | ICD-10-CM

## 2021-02-18 ENCOUNTER — Ambulatory Visit
Admission: RE | Admit: 2021-02-18 | Discharge: 2021-02-18 | Disposition: A | Discharge status: Home / Self Care | Payer: 59 | Source: Ambulatory Visit | Attending: Obstetrics and Gynecology | Admitting: Obstetrics and Gynecology

## 2021-02-18 ENCOUNTER — Other Ambulatory Visit: Payer: Self-pay | Admitting: Obstetrics and Gynecology

## 2021-02-18 ENCOUNTER — Other Ambulatory Visit: Payer: Self-pay

## 2021-02-18 DIAGNOSIS — N6489 Other specified disorders of breast: Secondary | ICD-10-CM

## 2022-04-07 ENCOUNTER — Other Ambulatory Visit: Payer: Self-pay | Admitting: Obstetrics and Gynecology

## 2022-04-07 DIAGNOSIS — R928 Other abnormal and inconclusive findings on diagnostic imaging of breast: Secondary | ICD-10-CM

## 2022-04-07 DIAGNOSIS — R9230 Dense breasts, unspecified: Secondary | ICD-10-CM

## 2022-06-09 ENCOUNTER — Ambulatory Visit: Admission: RE | Admit: 2022-06-09 | Payer: Self-pay | Source: Ambulatory Visit

## 2022-06-09 ENCOUNTER — Ambulatory Visit
Admission: RE | Admit: 2022-06-09 | Discharge: 2022-06-09 | Disposition: A | Discharge status: Home / Self Care | Payer: BC Managed Care – PPO | Source: Ambulatory Visit | Attending: Obstetrics and Gynecology | Admitting: Obstetrics and Gynecology

## 2022-06-09 DIAGNOSIS — R928 Other abnormal and inconclusive findings on diagnostic imaging of breast: Secondary | ICD-10-CM

## 2022-10-28 ENCOUNTER — Encounter: Payer: Self-pay | Admitting: Internal Medicine

## 2022-11-24 ENCOUNTER — Ambulatory Visit (AMBULATORY_SURGERY_CENTER): Payer: BC Managed Care – PPO

## 2022-11-24 VITALS — Ht 62.5 in | Wt 177.0 lb

## 2022-11-24 DIAGNOSIS — Z1211 Encounter for screening for malignant neoplasm of colon: Secondary | ICD-10-CM

## 2022-11-24 MED ORDER — NA SULFATE-K SULFATE-MG SULF 17.5-3.13-1.6 GM/177ML PO SOLN: 1.0000 | Freq: Once | ORAL | 0 refills | Status: AC

## 2022-11-24 NOTE — Progress Notes (Signed)
Pre visit completed via phone call; Patient verified name, DOB, and address;  No egg or soy allergy known to patient;  No issues known to pt with past sedation with any surgeries or procedures; Patient denies ever being told they had issues or difficulty with intubation;  No FH of Malignant Hyperthermia; Pt is not on diet pills; Pt is not on home 02;  Pt is not on blood thinners;  Pt denies issues with constipation;  No A fib or A flutter;  Have any cardiac testing pending--NO Insurance verified during PV appt--- BCBS  Pt can ambulate without assistance;  Pt denies use of chewing tobacco Discussed diabetic/weight loss medication holds; Discussed NSAID holds; Checked BMI to be less than 50; Pt instructed to use Singlecare.com or GoodRx for a price reduction on prep;  Patient's chart reviewed by Cathlyn Parsons CNRA prior to previsit and patient appropriate for the [facility].  Pre visit completed and red dot placed by patient's name on their procedure day (on provider's schedule).    Instructions sent to patient via MyChart per patient request;

## 2022-12-15 ENCOUNTER — Encounter: Payer: BC Managed Care – PPO | Admitting: Internal Medicine

## 2022-12-20 ENCOUNTER — Telehealth: Payer: Self-pay

## 2022-12-20 NOTE — Telephone Encounter (Signed)
Patient rescheduled her colonoscopy to a different date after PV appt was completed;  sending revised prep instructions via MyChart;

## 2023-01-12 ENCOUNTER — Ambulatory Visit: Payer: BC Managed Care – PPO | Admitting: Cardiovascular Disease

## 2023-01-24 ENCOUNTER — Encounter: Payer: Self-pay | Admitting: Internal Medicine

## 2023-02-07 ENCOUNTER — Encounter: Payer: BC Managed Care – PPO | Admitting: Internal Medicine

## 2023-03-07 ENCOUNTER — Encounter (HOSPITAL_BASED_OUTPATIENT_CLINIC_OR_DEPARTMENT_OTHER): Payer: Self-pay | Admitting: Cardiovascular Disease

## 2023-03-07 ENCOUNTER — Encounter (HOSPITAL_BASED_OUTPATIENT_CLINIC_OR_DEPARTMENT_OTHER): Payer: Self-pay

## 2023-03-07 ENCOUNTER — Ambulatory Visit (HOSPITAL_BASED_OUTPATIENT_CLINIC_OR_DEPARTMENT_OTHER): Payer: BC Managed Care – PPO | Admitting: Cardiovascular Disease

## 2023-03-07 VITALS — BP 118/80 | HR 99 | Ht 63.0 in | Wt 201.6 lb

## 2023-03-07 DIAGNOSIS — I10 Essential (primary) hypertension: Secondary | ICD-10-CM | POA: Diagnosis not present

## 2023-03-07 DIAGNOSIS — F419 Anxiety disorder, unspecified: Secondary | ICD-10-CM | POA: Diagnosis not present

## 2023-03-07 DIAGNOSIS — Z8249 Family history of ischemic heart disease and other diseases of the circulatory system: Secondary | ICD-10-CM

## 2023-03-07 DIAGNOSIS — R0789 Other chest pain: Secondary | ICD-10-CM | POA: Diagnosis not present

## 2023-03-07 HISTORY — DX: Family history of ischemic heart disease and other diseases of the circulatory system: Z82.49

## 2023-03-07 HISTORY — DX: Essential (primary) hypertension: I10

## 2023-03-07 HISTORY — DX: Other chest pain: R07.89

## 2023-03-07 NOTE — Progress Notes (Signed)
Cardiology Office Note:  .   Date:  03/07/2023  ID:  Aimee Kerr, DOB January 11, 1976, MRN [id number] PCP: Dois Davenport, MD  [facility] Providers Cardiologist:  None    History of Present Illness: .    Aimee Kerr is a 47 y.o. female who is being seen today for the evaluation of cardiovascular risk at the request of Zelphia Cairo, MD. Aimee Kerr has a history of ischemic heart disease in family members.  She also had an abnormal heart rate during pregnancy.  She discussed this with Dr. Renaldo Fiddler and was referred to cardiology for further evaluation.  Aimee Kerr presents with intermittent chest pain and tightness, which began in her twenties. The discomfort, initially attributed to acid reflux, occurs sporadically, approximately every two to three months, typically in the evenings around seven or eight o'clock. Each episode lasts about five minutes and is associated with a tingling sensation in the right arm and shortness of breath. The patient notes that these symptoms were more frequent when she was taking Wellbutrin, lamotrigine, and Ritalin, but have largely subsided since discontinuing these medications.  Aimee Kerr also reports occasional difficulty breathing, which seems to coincide with the episodes of chest tightness. She denies any leg or foot swelling. The patient has a history of high blood pressure, which was diagnosed approximately five years ago following her mother's death. She is currently managing her blood pressure with medication. The patient is a former smoker, having quit in 03/18/15, and has been smoke-free since. She admits to a lack of regular exercise, citing a lack of motivation as the primary barrier.   Her mother was diagnosed with heart failure in 03/18/15 and subsequently died from a stroke, which was attributed to her heart failure. An ICD was advised but she declined.  She did not undergo any genetic testing.  Two of the patient's maternal aunts also had heart  failure, and another aunt died from cardiomyopathy. The patient's maternal grandmother had a brain aneurysm. Given this significant family history, the patient's mother's cardiologist recommended that she undergo a heart check-up when she turned [age].  She denies any history of SCD.      ROS:  As per HPI  Studies Reviewed: Marland Kitchen   EKG Interpretation Date/Time:  Monday March 07 2023 10:46:10 EST Ventricular Rate:  99 PR Interval:  142 QRS Duration:  78 QT Interval:  392 QTC Calculation: 503 R Axis:   12  Text Interpretation: Normal sinus rhythm Prolonged QT Since last tracing QT has lengthened Confirmed by Chilton Si ([id number]) on 03/07/2023 10:51:03 AM    Risk Assessment/Calculations:             Physical Exam:    VS:  BP 118/80   Pulse 99   Ht 5\' 3"  (1.6 m)   Wt 201 lb 9.6 oz (91.4 kg)   SpO2 100%   BMI 35.71 kg/m  , BMI Body mass index is 35.71 kg/m. GENERAL:  Well appearing HEENT: Pupils equal round and reactive, fundi not visualized, oral mucosa unremarkable NECK:  No jugular venous distention, waveform within normal limits, carotid upstroke brisk and symmetric, no bruits, no thyromegaly LUNGS:  Clear to auscultation bilaterally HEART:  RRR.  PMI not displaced or sustained,S1 and S2 within normal limits, no S3, no S4, no clicks, no rubs, no murmurs ABD:  Flat, positive bowel sounds normal in frequency in pitch, no bruits, no rebound, no guarding, no midline pulsatile mass, no hepatomegaly, no splenomegaly EXT:  2  plus pulses throughout, no edema, no cyanosis no clubbing SKIN:  No rashes no nodules NEURO:  Cranial nerves II through XII grossly intact, motor grossly intact throughout PSYCH:  Cognitively intact, oriented to person place and time   ASSESSMENT AND PLAN: .    # Family History of Heart Failure Strong family history of heart failure and cardiomyopathy. No current symptoms of heart failure. No murmurs heard on examination. -Order echocardiogram to assess  cardiac structure and function. -Plan to repeat echocardiogram in 5 years if initial study is normal.  # Chest Pain Intermittent chest pain, typically in the evening, lasting approximately 5 minutes. Associated with tingling in right arm and shortness of breath. Symptoms improved after discontinuation of Wellbutrin, lamotrigine, and Ritalin. -Encourage regular exercise and monitor for exacerbation of symptoms with exertion.  # Hypertension Controlled with losartan, HCTZ and amlodipine.  -Continue current antihypertensive regimen. - Encouraged increased exercise.  At least 150 minutes weekly.  Follow-up -Schedule follow-up appointment in a few months to assess tolerance of increased exercise.       Signed, Chilton Si, MD

## 2023-03-07 NOTE — Patient Instructions (Addendum)
Medication Instructions:  Your physician recommends that you continue on your current medications as directed. Please refer to the Current Medication list given to you today.   *If you need a refill on your cardiac medications before your next appointment, please call your pharmacy*  Lab Work: NONE  Testing/Procedures: Your physician has requested that you have an echocardiogram. Echocardiography is a painless test that uses sound waves to create images of your heart. It provides your doctor with information about the size and shape of your heart and how well your heart's chambers and valves are working. This procedure takes approximately one hour. There are no restrictions for this procedure. Please do NOT wear cologne, perfume, aftershave, or lotions (deodorant is allowed). Please arrive 15 minutes prior to your appointment time.  Please note: We ask at that you not bring children with you during ultrasound (echo/ vascular) testing. Due to room size and safety concerns, children are not allowed in the ultrasound rooms during exams. Our front office staff cannot provide observation of children in our lobby area while testing is being conducted. An adult accompanying a patient to their appointment will only be allowed in the ultrasound room at the discretion of the ultrasound technician under special circumstances. We apologize for any inconvenience.   Follow-Up: At [facility], you and your health needs are our priority.  As part of our continuing mission to provide you with exceptional heart care, we have created designated Provider Care Teams.  These Care Teams include your primary Cardiologist (physician) and Advanced Practice Providers (APPs -  Physician Assistants and Nurse Practitioners) who all work together to provide you with the care you need, when you need it.  We recommend signing up for the patient portal called "MyChart".  Sign up information is provided on this After Visit  Summary.  MyChart is used to connect with patients for Virtual Visits (Telemedicine).  Patients are able to view lab/test results, encounter notes, upcoming appointments, etc.  Non-urgent messages can be sent to your provider as well.   To learn more about what you can do with MyChart, go to [email or web address].    Your next appointment:   3 month(s)  Provider:   Gillian Shields, NP    Exercise recommendations: The American Heart Association recommends 150 minutes of moderate intensity exercise weekly. Try 30 minutes of moderate intensity exercise 4-5 times per week. This could include walking, jogging, or swimming.

## 2023-04-13 ENCOUNTER — Other Ambulatory Visit (HOSPITAL_BASED_OUTPATIENT_CLINIC_OR_DEPARTMENT_OTHER): Payer: BLUE CROSS/BLUE SHIELD

## 2023-05-04 ENCOUNTER — Ambulatory Visit (HOSPITAL_BASED_OUTPATIENT_CLINIC_OR_DEPARTMENT_OTHER): Payer: BLUE CROSS/BLUE SHIELD

## 2023-05-04 DIAGNOSIS — I10 Essential (primary) hypertension: Secondary | ICD-10-CM

## 2023-05-04 DIAGNOSIS — Z8249 Family history of ischemic heart disease and other diseases of the circulatory system: Secondary | ICD-10-CM | POA: Diagnosis not present

## 2023-05-04 LAB — ECHOCARDIOGRAM COMPLETE
Area-P 1/2: 6.12 cm2
Calc EF: 66.7 %
S' Lateral: 3.08 cm
Single Plane A2C EF: 64.9 %
Single Plane A4C EF: 69.3 %

## 2023-05-25 ENCOUNTER — Encounter (HOSPITAL_BASED_OUTPATIENT_CLINIC_OR_DEPARTMENT_OTHER): Payer: Self-pay

## 2023-06-01 ENCOUNTER — Encounter: Payer: Self-pay | Admitting: Cardiovascular Disease

## 2023-06-10 ENCOUNTER — Ambulatory Visit (HOSPITAL_BASED_OUTPATIENT_CLINIC_OR_DEPARTMENT_OTHER): Payer: BC Managed Care – PPO | Admitting: Family

## 2023-09-12 ENCOUNTER — Ambulatory Visit (HOSPITAL_BASED_OUTPATIENT_CLINIC_OR_DEPARTMENT_OTHER): Payer: BC Managed Care – PPO | Admitting: Family

## 2023-12-06 ENCOUNTER — Ambulatory Visit (INDEPENDENT_AMBULATORY_CARE_PROVIDER_SITE_OTHER): Admitting: Family

## 2023-12-06 ENCOUNTER — Encounter (HOSPITAL_BASED_OUTPATIENT_CLINIC_OR_DEPARTMENT_OTHER): Payer: Self-pay | Admitting: Family

## 2023-12-06 VITALS — BP 126/74 | HR 82 | Ht 62.0 in | Wt 195.4 lb

## 2023-12-06 DIAGNOSIS — Z8249 Family history of ischemic heart disease and other diseases of the circulatory system: Secondary | ICD-10-CM | POA: Diagnosis not present

## 2023-12-06 DIAGNOSIS — I10 Essential (primary) hypertension: Secondary | ICD-10-CM

## 2023-12-06 NOTE — Progress Notes (Signed)
 Cardiology Office Note   Date:  12/06/2023  ID:  Aimee, Kerr [birth year], MRN [id number] PCP: Burney Darice CROME, MD  [facility] Providers Cardiologist:  None     History of Present Illness Aimee Kerr is a 48 y.o. female with history of hypertension, anxiety. Family history of heart failure, stroke, hypertension.   Established with Dr. Raford 03/07/23  for evaluation of cardiovascular risk. She had atypical chest pain most consistent with acid reflux.  Echocardiogram due to family history of cardiovascular disease 05/04/2023 normal LVEF 60 to 65%, no RWMA, normal diastolic parameters, no significant valvular abnormalities.  She was recommended to increase her physical activity for reduction of cardiovascular risk.  Presents today for follow-up independently.  Enjoys sending home with her 46-year-old daughter. Reports no shortness of breath nor dyspnea on exertion. Reports no chest pain, pressure, or tightness. No edema, orthopnea, PND. Reports no palpitations.  Exercising by walking though does note she could increase frequency.  She endorses following heart healthy diet and eating predominantly at home.  ROS: Please see the history of present illness.    All other systems reviewed and are negative.   Studies Reviewed      Cardiac Studies & Procedures   ______________________________________________________________________________________________     ECHOCARDIOGRAM  ECHOCARDIOGRAM COMPLETE 05/04/2023  Narrative ECHOCARDIOGRAM REPORT    Patient Name:   Aimee Kerr Date of Exam: 05/04/2023 Medical Rec #:  [id number]       Height:       63.0 in Accession #:    [id number]      Weight:       201.6 lb Date of Birth:  07-25-1975       BSA:          1.940 m Patient Age:    47 years        BP:           115/78 mmHg Patient Gender: F               HR:           106 bpm. Exam Location:  Outpatient  Procedure: 3D Echo, 2D Echo, Cardiac Doppler, Color Doppler and  Strain Analysis  Indications:    Family history of heart failure  History:        Patient has no prior history of Echocardiogram examinations. Signs/Symptoms:Chest Pain; Risk Factors:Hypertension and Former Smoker.  Sonographer:    Orvil Holmes RDCS Referring Phys: [id number] TIFFANY Stevinson  IMPRESSIONS   1. Left ventricular ejection fraction, by estimation, is 60 to 65%. The left ventricle has normal function. The left ventricle has no regional wall motion abnormalities. Left ventricular diastolic parameters were normal. 2. Right ventricular systolic function is normal. The right ventricular size is normal. Tricuspid regurgitation signal is inadequate for assessing PA pressure. 3. The mitral valve is grossly normal. No evidence of mitral valve regurgitation. No evidence of mitral stenosis. 4. The aortic valve is tricuspid. Aortic valve regurgitation is not visualized. No aortic stenosis is present. 5. The inferior vena cava is normal in size with greater than 50% respiratory variability, suggesting right atrial pressure of 3 mmHg.  Comparison(s): No prior Echocardiogram.  FINDINGS Left Ventricle: Left ventricular ejection fraction, by estimation, is 60 to 65%. The left ventricle has normal function. The left ventricle has no regional wall motion abnormalities. The left ventricular internal cavity size was normal in size. There is no left ventricular hypertrophy. Left ventricular diastolic parameters were normal.  Right  Ventricle: The right ventricular size is normal. No increase in right ventricular wall thickness. Right ventricular systolic function is normal. Tricuspid regurgitation signal is inadequate for assessing PA pressure.  Left Atrium: Left atrial size was normal in size.  Right Atrium: Right atrial size was normal in size.  Pericardium: There is no evidence of pericardial effusion.  Mitral Valve: The mitral valve is grossly normal. There is mild thickening of the  mitral valve leaflet(s). Normal mobility of the mitral valve leaflets. No evidence of mitral valve regurgitation. No evidence of mitral valve stenosis.  Tricuspid Valve: The tricuspid valve is normal in structure. Tricuspid valve regurgitation is not demonstrated. No evidence of tricuspid stenosis.  Aortic Valve: The aortic valve is tricuspid. Aortic valve regurgitation is not visualized. No aortic stenosis is present.  Pulmonic Valve: The pulmonic valve was normal in structure. Pulmonic valve regurgitation is not visualized. No evidence of pulmonic stenosis.  Aorta: The aortic root and ascending aorta are structurally normal, with no evidence of dilitation.  Venous: The inferior vena cava is normal in size with greater than 50% respiratory variability, suggesting right atrial pressure of 3 mmHg.  IAS/Shunts: The atrial septum is grossly normal.   LEFT VENTRICLE PLAX 2D LVIDd:         4.78 cm      Diastology LVIDs:         3.08 cm      LV e' medial:    12.80 cm/s LV PW:         1.29 cm      LV E/e' medial:  3.5 LV IVS:        0.68 cm      LV e' lateral:   11.00 cm/s LVOT diam:     1.80 cm      LV E/e' lateral: 4.1 LV SV:         37 LV SV Index:   19 LVOT Area:     2.54 cm  LV Volumes (MOD) LV vol d, MOD A2C: 125.0 ml LV vol d, MOD A4C: 108.0 ml LV vol s, MOD A2C: 43.9 ml LV vol s, MOD A4C: 33.2 ml LV SV MOD A2C:     81.1 ml LV SV MOD A4C:     108.0 ml LV SV MOD BP:      77.7 ml  RIGHT VENTRICLE RV Basal diam:  2.48 cm RV Mid diam:    1.93 cm RV S prime:     13.10 cm/s TAPSE (M-mode): 2.4 cm  LEFT ATRIUM             Index        RIGHT ATRIUM           Index LA diam:        3.70 cm 1.91 cm/m   RA Area:     11.50 cm LA Vol (A2C):   25.3 ml 13.04 ml/m  RA Volume:   27.60 ml  14.23 ml/m LA Vol (A4C):   28.0 ml 14.43 ml/m LA Biplane Vol: 29.2 ml 15.05 ml/m AORTIC VALVE LVOT Vmax:   87.50 cm/s LVOT Vmean:  64.200 cm/s LVOT VTI:    0.144 m  AORTA Ao Root diam: 2.50  cm Ao Asc diam:  3.20 cm  MITRAL VALVE MV Area (PHT): 6.12 cm    SHUNTS MV Decel Time: 124 msec    Systemic VTI:  0.14 m MV E velocity: 44.70 cm/s  Systemic Diam: 1.80 cm MV A velocity: 66.10 cm/s MV  E/A ratio:  0.68  Sunit Tolia Electronically signed by [employer] Signature Date/Time: 05/04/2023/1:42:27 PM    Final          ______________________________________________________________________________________________      Risk Assessment/Calculations           Physical Exam VS:  BP 126/74   Pulse 82   Ht 5' 2 (1.575 m)   Wt 195 lb 6.4 oz (88.6 kg)   SpO2 98%   BMI 35.74 kg/m        Wt Readings from Last 3 Encounters:  12/06/23 195 lb 6.4 oz (88.6 kg)  03/07/23 201 lb 9.6 oz (91.4 kg)  11/24/22 177 lb (80.3 kg)    GEN: Well nourished, well developed in no acute distress NECK: No JVD; No carotid bruits CARDIAC: RRR, no murmurs, rubs, gallops RESPIRATORY:  Clear to auscultation without rales, wheezing or rhonchi  ABDOMEN: Soft, non-tender, non-distended EXTREMITIES:  No edema; No deformity   ASSESSMENT AND PLAN  HTN - BP well controlled. Continue current antihypertensive regimen as prescribed by PCP.  BMI 35 - Weight loss via diet and exercise encouraged. Discussed the impact being overweight would have on cardiovascular risk. Encouraged participation in Right Start exercise program.   Family history of cardiovascular disease and heart failure - Echo 04/2023 normal LVEF, no RWMA, normal valves. Stable with no anginal symptoms. No indication for ischemic evaluation.  Recommend aiming for 150 minutes of moderate intensity activity per week and following a heart healthy diet.         Dispo: follow up in 1 year   Signed, Reche GORMAN Finder, NP

## 2023-12-06 NOTE — Patient Instructions (Addendum)
 Medication Instructions:   Your physician recommends that you continue on your current medications as directed. Please refer to the Current Medication list given to you today.  *If you need a refill on your cardiac medications before your next appointment, please call your pharmacy*    Follow-Up: At [facility], you and your health needs are our priority.  As part of our continuing mission to provide you with exceptional heart care, our providers are all part of one team.  This team includes your primary Cardiologist (physician) and Advanced Practice Providers or APPs (Physician Assistants and Nurse Practitioners) who all work together to provide you with the care you need, when you need it.  Your next appointment:   1 year(s)  Provider:   Annabella Scarce, MD or Reche Finder, NP

## 2024-04-04 ENCOUNTER — Encounter: Payer: Self-pay | Admitting: Family Medicine

## 2024-04-23 ENCOUNTER — Ambulatory Visit (HOSPITAL_BASED_OUTPATIENT_CLINIC_OR_DEPARTMENT_OTHER): Admitting: Physician Assistant

## 2024-04-23 ENCOUNTER — Other Ambulatory Visit (HOSPITAL_BASED_OUTPATIENT_CLINIC_OR_DEPARTMENT_OTHER): Payer: Self-pay | Admitting: Physician Assistant

## 2024-04-23 ENCOUNTER — Ambulatory Visit (INDEPENDENT_AMBULATORY_CARE_PROVIDER_SITE_OTHER)

## 2024-04-23 DIAGNOSIS — M545 Low back pain, unspecified: Secondary | ICD-10-CM

## 2024-04-23 MED ORDER — METHYLPREDNISOLONE 4 MG PO TBPK: ORAL_TABLET | ORAL | 0 refills | Status: AC

## 2024-04-23 NOTE — Progress Notes (Unsigned)
 "  Office Visit Note   Patient: Aimee Kerr           Date of Birth: 1975/12/05           MRN: [id number] Visit Date: 04/23/2024              Requested by: Burney Darice CROME, MD [address] [place],  [place] [address] PCP: Burney Darice CROME, MD   Assessment & Plan: Visit Diagnoses:  1. Lumbar spine pain     Plan: Findings consistent with low back pain.  Talked about the natural history of this.  We will try having her do a Medrol  Dosepak with some muscle relaxant.  Have given her the warning signs of anything progressive right now she is neurologically intact.  No evidence of an infective process..  Will also call her in some physical therapy I think this would be very helpful for her  Follow-Up Instructions: No follow-ups on file.   Orders:  Orders Placed This Encounter  Procedures   DG Lumbar Spine Complete   No orders of the defined types were placed in this encounter.     Procedures: No procedures performed   Clinical Data: No additional findings.   Subjective: No chief complaint on file.   HPI patient is a pleasant 49 year old woman comes in with a history of low back pain.  Denies any particular injury.  Denies any significant history of back pain in the past.  Review of Systems  All other systems reviewed and are negative.    Objective: Vital Signs: There were no vitals taken for this visit.  Physical Exam Constitutional:      Appearance: Normal appearance.  Pulmonary:     Effort: Pulmonary effort is normal.  Skin:    General: Skin is warm and dry.  Neurological:     General: No focal deficit present.     Mental Status: She is alert.     Ortho Exam Examination of her low back she has no step-offs no crepitus no erythema.  She has good strength with dorsiflexion plantarflexion of her ankles extension and flexion of her legs.  Some pain with forward flexion and extension.  Has spasm palpable within the musculature of the  paravertebral muscles of the lower back Specialty Comments:  No specialty comments available.  Imaging: No results found.   PMFS History: Patient Active Problem List   Diagnosis Date Noted   Family history of heart failure 03/07/2023   Primary hypertension 03/07/2023   Atypical chest pain 03/07/2023   Migraine 06/21/2019   S/P cesarean section 04/09/2017   Fibroids 04/08/2017   History of herpes genitalis 05/15/2015   Personal history of other diseases of the nervous system and sense organs 03/17/2015   Anxiety 03/08/2014   Uterine leiomyoma 11/20/2013   Past Medical History:  Diagnosis Date   AMA (advanced maternal age) primigravida 35+, third trimester    Anxiety    on meds   Atypical chest pain 03/07/2023   Depression    on meds   Family history of heart failure 03/07/2023   Fibroids    GERD (gastroesophageal reflux disease)    on meds   HSV (herpes simplex virus) anogenital infection    Hypertension    on meds   Migraines    Primary hypertension 03/07/2023   Seasonal allergies     Family History  Problem Relation Age of Onset   Heart disease Mother    Heart failure Mother 42  Stroke Mother    Lupus Sister    Hypertension Maternal Aunt    Heart failure Maternal Aunt    Seizures Maternal Aunt    Breast cancer Maternal Aunt    Rheum arthritis Maternal Aunt    Colon cancer Maternal Uncle    Diabetes Maternal Grandmother    Aneurysm Maternal Grandmother    Colon cancer Maternal Grandfather    Esophageal cancer Neg Hx     Past Surgical History:  Procedure Laterality Date   CESAREAN SECTION N/A 04/08/2017   Procedure: CESAREAN SECTION;  Surgeon: Latisha Medford, MD;  Location: [facility];  Service: Obstetrics;  Laterality: N/A;   WISDOM TOOTH EXTRACTION     Social History   Occupational History   Not on file  Tobacco Use   Smoking status: Former   Smokeless tobacco: Never  Substance and Sexual Activity   Alcohol use: Not Currently   Drug  use: No   Sexual activity: Not Currently    Birth control/protection: None        "

## 2024-04-24 ENCOUNTER — Encounter (HOSPITAL_BASED_OUTPATIENT_CLINIC_OR_DEPARTMENT_OTHER): Payer: Self-pay | Admitting: Physician Assistant

## 2024-06-02 ENCOUNTER — Ambulatory Visit (HOSPITAL_BASED_OUTPATIENT_CLINIC_OR_DEPARTMENT_OTHER): Admitting: Rehabilitative and Restorative Service Providers"
